# Patient Record
Sex: Female | Born: 1985 | Race: Black or African American | Hispanic: No | Marital: Married | Smoking: Current every day smoker
Health system: Southern US, Community
[De-identification: ages and names within clinical notes are randomized; demographics above are authoritative.]

## PROBLEM LIST (undated history)

## (undated) DIAGNOSIS — F99 Mental disorder, not otherwise specified: Secondary | ICD-10-CM

## (undated) DIAGNOSIS — IMO0002 Reserved for concepts with insufficient information to code with codable children: Secondary | ICD-10-CM

## (undated) DIAGNOSIS — F419 Anxiety disorder, unspecified: Secondary | ICD-10-CM

## (undated) DIAGNOSIS — T7840XA Allergy, unspecified, initial encounter: Secondary | ICD-10-CM

## (undated) DIAGNOSIS — F32A Depression, unspecified: Secondary | ICD-10-CM

## (undated) DIAGNOSIS — I10 Essential (primary) hypertension: Secondary | ICD-10-CM

## (undated) HISTORY — PX: FRACTURE SURGERY: SHX138

## (undated) HISTORY — DX: Allergy, unspecified, initial encounter: T78.40XA

## (undated) HISTORY — DX: Mental disorder, not otherwise specified: F99

## (undated) HISTORY — DX: Depression, unspecified: F32.A

## (undated) HISTORY — DX: Essential (primary) hypertension: I10

## (undated) HISTORY — DX: Reserved for concepts with insufficient information to code with codable children: IMO0002

## (undated) HISTORY — DX: Anxiety disorder, unspecified: F41.9

---
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
## 2005-11-21 ENCOUNTER — Emergency Department (HOSPITAL_COMMUNITY): Admission: EM | Admit: 2005-11-21 | Discharge: 2005-11-21 | Payer: Self-pay | Admitting: Family Medicine

## 2006-01-16 ENCOUNTER — Emergency Department (HOSPITAL_COMMUNITY): Admission: EM | Admit: 2006-01-16 | Discharge: 2006-01-16 | Payer: Self-pay | Admitting: Family Medicine

## 2006-05-15 ENCOUNTER — Emergency Department (HOSPITAL_COMMUNITY): Admission: EM | Admit: 2006-05-15 | Discharge: 2006-05-15 | Payer: Self-pay | Admitting: Family Medicine

## 2007-01-16 ENCOUNTER — Emergency Department (HOSPITAL_COMMUNITY): Admission: EM | Admit: 2007-01-16 | Discharge: 2007-01-16 | Payer: Self-pay | Admitting: Emergency Medicine

## 2007-03-18 ENCOUNTER — Emergency Department (HOSPITAL_COMMUNITY): Admission: EM | Admit: 2007-03-18 | Discharge: 2007-03-18 | Payer: Self-pay | Admitting: Emergency Medicine

## 2008-07-27 ENCOUNTER — Emergency Department (HOSPITAL_COMMUNITY): Admission: EM | Admit: 2008-07-27 | Discharge: 2008-07-27 | Payer: Self-pay | Admitting: Family Medicine

## 2010-07-22 ENCOUNTER — Other Ambulatory Visit: Payer: Self-pay | Admitting: Family Medicine

## 2010-07-22 DIAGNOSIS — IMO0002 Reserved for concepts with insufficient information to code with codable children: Secondary | ICD-10-CM

## 2010-07-22 DIAGNOSIS — Z3689 Encounter for other specified antenatal screening: Secondary | ICD-10-CM

## 2010-07-22 DIAGNOSIS — R87619 Unspecified abnormal cytological findings in specimens from cervix uteri: Secondary | ICD-10-CM

## 2010-07-22 HISTORY — DX: Reserved for concepts with insufficient information to code with codable children: IMO0002

## 2010-07-22 HISTORY — DX: Unspecified abnormal cytological findings in specimens from cervix uteri: R87.619

## 2010-07-23 ENCOUNTER — Ambulatory Visit (HOSPITAL_COMMUNITY)
Admission: RE | Admit: 2010-07-23 | Discharge: 2010-07-23 | Disposition: A | Discharge status: Home / Self Care | Payer: Medicaid Other | Source: Ambulatory Visit | Attending: Family Medicine | Admitting: Family Medicine

## 2010-07-23 ENCOUNTER — Other Ambulatory Visit: Payer: Self-pay | Admitting: Family Medicine

## 2010-07-23 DIAGNOSIS — O139 Gestational [pregnancy-induced] hypertension without significant proteinuria, unspecified trimester: Secondary | ICD-10-CM | POA: Insufficient documentation

## 2010-07-23 DIAGNOSIS — Z3689 Encounter for other specified antenatal screening: Secondary | ICD-10-CM

## 2010-07-25 ENCOUNTER — Inpatient Hospital Stay (HOSPITAL_COMMUNITY): Payer: Medicaid Other

## 2010-07-25 ENCOUNTER — Other Ambulatory Visit: Payer: Self-pay | Admitting: Family Medicine

## 2010-07-25 ENCOUNTER — Inpatient Hospital Stay (HOSPITAL_COMMUNITY)
Admission: AD | Admit: 2010-07-25 | Discharge: 2010-07-25 | Disposition: A | Discharge status: Home / Self Care | Payer: Medicaid Other | Source: Ambulatory Visit | Attending: Obstetrics & Gynecology | Admitting: Obstetrics & Gynecology

## 2010-07-25 DIAGNOSIS — O288 Other abnormal findings on antenatal screening of mother: Secondary | ICD-10-CM

## 2010-07-25 DIAGNOSIS — O4100X0 Oligohydramnios, unspecified trimester, not applicable or unspecified: Secondary | ICD-10-CM | POA: Insufficient documentation

## 2010-07-25 DIAGNOSIS — O368190 Decreased fetal movements, unspecified trimester, not applicable or unspecified: Secondary | ICD-10-CM | POA: Insufficient documentation

## 2010-07-28 ENCOUNTER — Ambulatory Visit (HOSPITAL_COMMUNITY)
Admission: RE | Admit: 2010-07-28 | Discharge: 2010-07-28 | Disposition: A | Discharge status: Home / Self Care | Payer: Medicaid Other | Source: Ambulatory Visit | Attending: Family Medicine | Admitting: Family Medicine

## 2010-07-28 DIAGNOSIS — O139 Gestational [pregnancy-induced] hypertension without significant proteinuria, unspecified trimester: Secondary | ICD-10-CM | POA: Insufficient documentation

## 2010-07-28 DIAGNOSIS — O288 Other abnormal findings on antenatal screening of mother: Secondary | ICD-10-CM

## 2010-07-28 DIAGNOSIS — O4100X0 Oligohydramnios, unspecified trimester, not applicable or unspecified: Secondary | ICD-10-CM | POA: Insufficient documentation

## 2010-07-31 ENCOUNTER — Other Ambulatory Visit: Payer: Self-pay | Admitting: Obstetrics & Gynecology

## 2010-07-31 DIAGNOSIS — O139 Gestational [pregnancy-induced] hypertension without significant proteinuria, unspecified trimester: Secondary | ICD-10-CM

## 2010-07-31 LAB — POCT URINALYSIS DIP (DEVICE)
Bilirubin Urine: NEGATIVE
Nitrite: NEGATIVE
pH: 6 (ref 5.0–8.0)

## 2010-08-01 NOTE — Op Note (Signed)
Jamie Blackwell, Jamie Blackwell             ACCOUNT NO.:  [id number]   MEDICAL RECORD NO.:  [id number]          PATIENT TYPE:  EMS   LOCATION:  ED                           FACILITY:  [facility]   PHYSICIAN:  Johnette Abraham, MD    DATE OF BIRTH:  10-May-1985   DATE OF PROCEDURE:  03/19/2007  DATE OF DISCHARGE:  03/18/2007                               OPERATIVE REPORT   PREOPERATIVE DIAGNOSIS:  Deep abscess of the posterior forearm.   POSTOPERATIVE DIAGNOSIS:  Deep abscess of the posterior forearm.   PROCEDURE:  Incision and drainage of deep abscess of forearm, packing  with Iodoform gauze, and application of a dressing.   SURGEON:  Johnette Abraham, M.D.   INDICATIONS FOR PROCEDURE:  The patient is a pleasant 25 year old black  female who has complained of about a week of swelling of her posterior  forearm and elbow area.  It subsequently has become increasingly warm,  tender to touch, and has now started draining purulence.  She presents  to the emergency room and hand surgery is consulted.  On evaluation, she  had an area of cellulitis extending approximately 10 cm distal to the  olecranon and a central portion of fluctuance and an open draining  wound.  Risks, benefits, and alternatives of drainage were discussed  with the patient.  Consent was obtained.   DESCRIPTION OF PROCEDURE:  The patient was given IV antibiotics by the  emergency department.  The posterior forearm and elbow were prepped and  draped with Betadine solution.  Following the area surrounding the  abscess was infiltrated with 2% lidocaine totaling approximately 10 mL.  A cruciate type incision was made over the point of fluctuance and the  actual open sore.  Purulence was expressed.  Blunt dissection around the  wound was performed unroofing any deep loculated abscesses.  Afterward  the wound was irrigated with irrigation solution and the wound was  thoroughly packed with Iodoform gauze.  Hemostasis was obtained with  direct pressure.  Following a sterile dressing was applied.  The patient  tolerated the procedure well and was sent home with instructions on  wound care as well as antibiotics and pain medicine.  She will follow up  with me on Monday or p.r.n. signs and symptoms worsen.   ANESTHESIA:  Local anesthetic.   COMPLICATIONS:  None.      Johnette Abraham, MD  Electronically Signed     HCC/MEDQ  D:  03/20/2007  T:  03/20/2007  Job:  [id number]

## 2010-08-04 ENCOUNTER — Other Ambulatory Visit: Payer: Medicaid Other

## 2010-08-04 DIAGNOSIS — O139 Gestational [pregnancy-induced] hypertension without significant proteinuria, unspecified trimester: Secondary | ICD-10-CM

## 2010-08-07 ENCOUNTER — Other Ambulatory Visit: Payer: Self-pay | Admitting: Obstetrics and Gynecology

## 2010-08-07 ENCOUNTER — Other Ambulatory Visit: Payer: Medicaid Other

## 2010-08-07 DIAGNOSIS — Z331 Pregnant state, incidental: Secondary | ICD-10-CM

## 2010-08-07 DIAGNOSIS — O139 Gestational [pregnancy-induced] hypertension without significant proteinuria, unspecified trimester: Secondary | ICD-10-CM

## 2010-08-07 DIAGNOSIS — IMO0002 Reserved for concepts with insufficient information to code with codable children: Secondary | ICD-10-CM

## 2010-08-07 LAB — POCT URINALYSIS DIP (DEVICE)
Protein, ur: NEGATIVE mg/dL
Specific Gravity, Urine: 1.025 (ref 1.005–1.030)
pH: 5.5 (ref 5.0–8.0)

## 2010-08-08 ENCOUNTER — Other Ambulatory Visit: Payer: Medicaid Other

## 2010-08-12 ENCOUNTER — Other Ambulatory Visit: Payer: Medicaid Other

## 2010-08-12 DIAGNOSIS — O139 Gestational [pregnancy-induced] hypertension without significant proteinuria, unspecified trimester: Secondary | ICD-10-CM

## 2010-08-14 ENCOUNTER — Other Ambulatory Visit: Payer: Self-pay | Admitting: Obstetrics and Gynecology

## 2010-08-14 DIAGNOSIS — Z331 Pregnant state, incidental: Secondary | ICD-10-CM

## 2010-08-14 DIAGNOSIS — O139 Gestational [pregnancy-induced] hypertension without significant proteinuria, unspecified trimester: Secondary | ICD-10-CM

## 2010-08-14 DIAGNOSIS — O4100X0 Oligohydramnios, unspecified trimester, not applicable or unspecified: Secondary | ICD-10-CM

## 2010-08-14 LAB — POCT URINALYSIS DIP (DEVICE)
Protein, ur: NEGATIVE mg/dL
Specific Gravity, Urine: 1.02 (ref 1.005–1.030)
Urobilinogen, UA: 0.2 mg/dL (ref 0.0–1.0)
pH: 6 (ref 5.0–8.0)

## 2010-08-18 ENCOUNTER — Other Ambulatory Visit: Payer: Medicaid Other

## 2010-08-18 DIAGNOSIS — Z331 Pregnant state, incidental: Secondary | ICD-10-CM

## 2010-08-18 DIAGNOSIS — O139 Gestational [pregnancy-induced] hypertension without significant proteinuria, unspecified trimester: Secondary | ICD-10-CM

## 2010-08-21 ENCOUNTER — Other Ambulatory Visit: Payer: Self-pay | Admitting: Obstetrics & Gynecology

## 2010-08-21 ENCOUNTER — Other Ambulatory Visit: Payer: Self-pay | Admitting: Family Medicine

## 2010-08-21 DIAGNOSIS — O139 Gestational [pregnancy-induced] hypertension without significant proteinuria, unspecified trimester: Secondary | ICD-10-CM

## 2010-08-21 DIAGNOSIS — I10 Essential (primary) hypertension: Secondary | ICD-10-CM

## 2010-08-21 DIAGNOSIS — O4100X0 Oligohydramnios, unspecified trimester, not applicable or unspecified: Secondary | ICD-10-CM

## 2010-08-21 LAB — POCT URINALYSIS DIP (DEVICE)
Nitrite: NEGATIVE
Protein, ur: NEGATIVE mg/dL
Urobilinogen, UA: 0.2 mg/dL (ref 0.0–1.0)
pH: 6 (ref 5.0–8.0)

## 2010-08-25 ENCOUNTER — Ambulatory Visit (HOSPITAL_COMMUNITY)
Admission: RE | Admit: 2010-08-25 | Discharge: 2010-08-25 | Disposition: A | Discharge status: Home / Self Care | Payer: Medicaid Other | Source: Ambulatory Visit | Attending: Obstetrics & Gynecology | Admitting: Obstetrics & Gynecology

## 2010-08-25 ENCOUNTER — Other Ambulatory Visit: Payer: Self-pay | Admitting: Obstetrics and Gynecology

## 2010-08-25 ENCOUNTER — Other Ambulatory Visit: Payer: Medicaid Other

## 2010-08-25 DIAGNOSIS — O139 Gestational [pregnancy-induced] hypertension without significant proteinuria, unspecified trimester: Secondary | ICD-10-CM | POA: Insufficient documentation

## 2010-08-25 DIAGNOSIS — I10 Essential (primary) hypertension: Secondary | ICD-10-CM

## 2010-08-25 DIAGNOSIS — Z3689 Encounter for other specified antenatal screening: Secondary | ICD-10-CM | POA: Insufficient documentation

## 2010-08-28 ENCOUNTER — Other Ambulatory Visit: Payer: Self-pay | Admitting: Obstetrics & Gynecology

## 2010-08-28 ENCOUNTER — Other Ambulatory Visit: Payer: Medicaid Other

## 2010-08-28 DIAGNOSIS — O139 Gestational [pregnancy-induced] hypertension without significant proteinuria, unspecified trimester: Secondary | ICD-10-CM

## 2010-08-28 LAB — POCT URINALYSIS DIP (DEVICE)
Nitrite: NEGATIVE
Protein, ur: NEGATIVE mg/dL
pH: 5.5 (ref 5.0–8.0)

## 2010-09-01 ENCOUNTER — Ambulatory Visit (HOSPITAL_COMMUNITY)
Admission: RE | Admit: 2010-09-01 | Discharge: 2010-09-01 | Disposition: A | Discharge status: Home / Self Care | Payer: Medicaid Other | Source: Ambulatory Visit | Attending: Obstetrics and Gynecology | Admitting: Obstetrics and Gynecology

## 2010-09-01 ENCOUNTER — Other Ambulatory Visit: Payer: Medicaid Other

## 2010-09-01 ENCOUNTER — Inpatient Hospital Stay (HOSPITAL_COMMUNITY)
Admission: AD | Admit: 2010-09-01 | Discharge: 2010-09-04 | DRG: 775 | Disposition: A | Discharge status: Home / Self Care | Payer: Medicaid Other | Source: Ambulatory Visit | Attending: Family Medicine | Admitting: Family Medicine

## 2010-09-01 ENCOUNTER — Other Ambulatory Visit: Payer: Self-pay | Admitting: Obstetrics and Gynecology

## 2010-09-01 DIAGNOSIS — O139 Gestational [pregnancy-induced] hypertension without significant proteinuria, unspecified trimester: Principal | ICD-10-CM | POA: Diagnosis present

## 2010-09-01 DIAGNOSIS — Z22330 Carrier of Group B streptococcus: Secondary | ICD-10-CM

## 2010-09-01 DIAGNOSIS — O4100X0 Oligohydramnios, unspecified trimester, not applicable or unspecified: Secondary | ICD-10-CM | POA: Diagnosis present

## 2010-09-01 DIAGNOSIS — O99892 Other specified diseases and conditions complicating childbirth: Secondary | ICD-10-CM | POA: Diagnosis present

## 2010-09-01 DIAGNOSIS — Z01812 Encounter for preprocedural laboratory examination: Secondary | ICD-10-CM

## 2010-09-01 DIAGNOSIS — Z3689 Encounter for other specified antenatal screening: Secondary | ICD-10-CM | POA: Insufficient documentation

## 2010-09-01 DIAGNOSIS — Z01818 Encounter for other preprocedural examination: Secondary | ICD-10-CM

## 2010-09-01 LAB — CBC
MCH: 26.6 pg (ref 26.0–34.0)
MCHC: 32.8 g/dL (ref 30.0–36.0)
RDW: 14.4 % (ref 11.5–15.5)

## 2010-09-01 LAB — RPR: RPR Ser Ql: NONREACTIVE

## 2010-09-02 DIAGNOSIS — O4100X0 Oligohydramnios, unspecified trimester, not applicable or unspecified: Secondary | ICD-10-CM

## 2010-09-02 DIAGNOSIS — O139 Gestational [pregnancy-induced] hypertension without significant proteinuria, unspecified trimester: Secondary | ICD-10-CM

## 2010-09-02 LAB — CBC
Hemoglobin: 10.8 g/dL — ABNORMAL LOW (ref 12.0–15.0)
MCHC: 33 g/dL (ref 30.0–36.0)
MCHC: 32.7 g/dL (ref 30.0–36.0)
MCV: 81.1 fL (ref 78.0–100.0)
Platelets: 190 10*3/uL (ref 150–400)
Platelets: 180 10*3/uL (ref 150–400)
RBC: 4.05 MIL/uL (ref 3.87–5.11)
RDW: 14.4 % (ref 11.5–15.5)
WBC: 13.2 10*3/uL — ABNORMAL HIGH (ref 4.0–10.5)

## 2010-09-02 LAB — ABO/RH: ABO/RH(D): O POS

## 2010-10-09 ENCOUNTER — Ambulatory Visit (INDEPENDENT_AMBULATORY_CARE_PROVIDER_SITE_OTHER): Payer: Medicaid Other | Admitting: Obstetrics and Gynecology

## 2010-10-09 ENCOUNTER — Encounter: Payer: Self-pay | Admitting: *Deleted

## 2010-10-09 NOTE — Progress Notes (Signed)
Subjective:     Patient ID: Jamie Blackwell, female   DOB: 05-10-1985, 25 y.o.   MRN: [id number]  HPI patient is a 25 year old African American female gravida 1 para 1001 who delivered on June 19 a normal spontaneous controlled delivery she first degree lacerations which required 2 stitches she delivered a. She delivered of a female infant weighing 7 lbs. 12 oz. She's breast-feeding and will continue for about one year. She has no complaints all is going well. The patient wants no contraception she states that she is a lesbian and just wanted to have the baby. She lives with her partner, who is 9 years older than she. She was induced the day before her due date because of hypertension since her delivery her blood pressure has been normal. She had no other complications and has had no headache and has had a normal postpartum recovery.   Review of Systems Review of systems is negative    Objective:   Physical Exam patient's current blood pressure is 109/76. Her pulse is 84 per minute. Patient present weight is 234 pounds down from 257 at the end of her pregnancy. Breasts: No dominant masses no nipple discharge, large and pendulous no cervical supraclavicular 8 or axillary nodes. Abdomen: Flat soft nontender no masses no organomegaly. Pelvic external genitalia normal the lungs within normal limits vagina clean and well rugated. Uterus well involuted cervix clean. Adnexa could not be palpated because of habits the patient.    Assessment:    patient had a Pap smear in May of this year which showed ascus without high-risk HPV therefore Pap smear will not be repeated until next May.    Plan:     Patient to continue prenatal vitamins as long as she is nursing. And is to return in May 2013 for Pap smear.

## 2011-02-07 ENCOUNTER — Emergency Department (HOSPITAL_COMMUNITY)
Admission: EM | Admit: 2011-02-07 | Discharge: 2011-02-07 | Disposition: A | Discharge status: Home / Self Care | Payer: Medicaid Other | Attending: Emergency Medicine | Admitting: Emergency Medicine

## 2011-02-07 ENCOUNTER — Encounter (HOSPITAL_COMMUNITY): Payer: Self-pay | Admitting: Adult Health

## 2011-02-07 ENCOUNTER — Emergency Department (HOSPITAL_COMMUNITY): Payer: Medicaid Other

## 2011-02-07 DIAGNOSIS — S139XXA Sprain of joints and ligaments of unspecified parts of neck, initial encounter: Secondary | ICD-10-CM | POA: Insufficient documentation

## 2011-02-07 DIAGNOSIS — S161XXA Strain of muscle, fascia and tendon at neck level, initial encounter: Secondary | ICD-10-CM

## 2011-02-07 DIAGNOSIS — W1809XA Striking against other object with subsequent fall, initial encounter: Secondary | ICD-10-CM | POA: Insufficient documentation

## 2011-02-07 DIAGNOSIS — I10 Essential (primary) hypertension: Secondary | ICD-10-CM | POA: Insufficient documentation

## 2011-02-07 DIAGNOSIS — Y92009 Unspecified place in unspecified non-institutional (private) residence as the place of occurrence of the external cause: Secondary | ICD-10-CM | POA: Insufficient documentation

## 2011-02-07 MED ORDER — ACETAMINOPHEN-CODEINE #3 300-30 MG PO TABS: 1.0000 | ORAL_TABLET | Freq: Four times a day (QID) | ORAL | Status: AC | PRN

## 2011-02-07 MED ORDER — IBUPROFEN 800 MG PO TABS: 800.0000 mg | ORAL_TABLET | Freq: Three times a day (TID) | ORAL | Status: AC

## 2011-02-07 MED ORDER — DIAZEPAM 5 MG PO TABS: 5.0000 mg | ORAL_TABLET | Freq: Two times a day (BID) | ORAL | Status: AC

## 2011-02-07 MED ORDER — DIAZEPAM 5 MG PO TABS
5.0000 mg | ORAL_TABLET | Freq: Once | ORAL | Status: AC
  Administered 2011-02-07: 5 mg via ORAL
  Filled 2011-02-07: qty 1

## 2011-02-07 MED ORDER — IBUPROFEN 800 MG PO TABS
800.0000 mg | ORAL_TABLET | Freq: Once | ORAL | Status: AC
  Administered 2011-02-07: 800 mg via ORAL
  Filled 2011-02-07: qty 1

## 2011-02-07 MED ORDER — ACETAMINOPHEN-CODEINE #3 300-30 MG PO TABS
1.0000 | ORAL_TABLET | Freq: Once | ORAL | Status: AC
  Administered 2011-02-07: 1 via ORAL
  Filled 2011-02-07: qty 1

## 2011-02-07 NOTE — ED Provider Notes (Signed)
History     CSN: [id number] Arrival date & time: 02/07/2011 12:41 PM   None     Chief Complaint  Patient presents with  . Neck Injury    (Consider location/radiation/quality/duration/timing/severity/associated sxs/prior treatment) HPI  Patient presents to emergency department complaining of gradual onset left-sided neck pain after she states she slipped in the bathroom this morning falling into the counter hitting the left side of her neck into the countertop. Patient states she did not fall to the ground but caught herself with her arms and hitting her neck. Denies loss of consciousness. Patient states that the day progressed increasing pain in the left side of her neck with increasing stiffness. Patient has not taken anything for pain prior to arrival. Patient states pain is aggravated by movement and therefore has to keep her head "completely still." Denies numbness or tingling into her upper extremities. Denies weakness of upper extremities. Denies any other injury or pain. Patient takes no medicine other than prenatal vitamin on a regular basis. Patient is 5 months postpartum and no longer nursing.  Past Medical History  Diagnosis Date  . Hypertension   . Abnormal Pap smear 07/22/10    ascus  . Mental disorder     depression    History reviewed. No pertinent past surgical history.  Family History  Problem Relation Age of Onset  . Cancer Paternal Grandmother   . Hypertension Mother     History  Substance Use Topics  . Smoking status: Former Smoker    Types: Cigarettes  . Smokeless tobacco: Never Used  . Alcohol Use: No    OB History    Grav Para Term Preterm Abortions TAB SAB Ect Mult Living   1 1 1  0 0 0 0 0 0 1      Review of Systems  All other systems reviewed and are negative.    Allergies  Review of patient's allergies indicates no known allergies.  Home Medications   Current Outpatient Rx  Name Route Sig Dispense Refill  . NATALCARE PIC 60-1 MG  PO TABS Oral Take 1 tablet by mouth daily with breakfast.        BP 116/75  Pulse 75  Temp(Src) 98.2 F (36.8 C) (Oral)  Resp 21  SpO2 100%  Physical Exam  Nursing note and vitals reviewed. Constitutional: She is oriented to person, place, and time. She appears well-developed and well-nourished. No distress.  HENT:  Head: Normocephalic and atraumatic.  Eyes: Conjunctivae and EOM are normal. Pupils are equal, round, and reactive to light.  Neck: Neck supple. No tracheal deviation present.       Soft tissue tenderness to palpation of left lateral neck with muscle spasticity but no bruising, crepitus, or break in skin. No bruit. Mild midline tenderness to palpation. Decreased range of motion due to pain lateral neck.  Cardiovascular: Normal rate, regular rhythm, normal heart sounds and intact distal pulses.  Exam reveals no gallop and no friction rub.   No murmur heard. Pulmonary/Chest: Effort normal and breath sounds normal. No respiratory distress. She has no wheezes. She has no rales. She exhibits no tenderness.  Abdominal: Bowel sounds are normal. She exhibits no distension and no mass. There is no tenderness. There is no rebound and no guarding.  Musculoskeletal: Normal range of motion. She exhibits tenderness. She exhibits no edema.       Mild midline cervical tenderness to palpation but mostly lateral soft tissue tenderness to palpation.  Full range of motion of bilateral upper  extremities with normal reflexes and strength.  Neurological: She is alert and oriented to person, place, and time. She has normal reflexes. No cranial nerve deficit. Coordination normal.  Skin: Skin is warm and dry. No rash noted. She is not diaphoretic. No erythema.  Psychiatric: She has a normal mood and affect.    ED Course  Procedures (including critical care time)  PO valium, ibuprofen, tylenol #3  Labs Reviewed - No data to display Dg Cervical Spine Complete  02/07/2011  *RADIOLOGY REPORT*   Clinical Data: Larey Seat  CERVICAL SPINE - COMPLETE 4+ VIEW  Comparison: None.  Findings: Negative for fracture, dislocation, or other acute bone abnormality.  No prevertebral soft tissue swelling.  No significant degenerative change.  IMPRESSION:  Negative for fracture or other acute abnormality.  Original Report Authenticated By: Thora Lance III, M.D.     1. Cervical strain       MDM  No acute findings on x-ray of cervical spine with left-sided muscle spasticity but is consistent with cervical strain but no signs or symptoms of central cord compression. Patient has bilateral upper extremity strength of 5 out of 5 and normal reflexes.       Jenness Corner, [place] 02/07/11 1436

## 2011-02-07 NOTE — ED Provider Notes (Signed)
Medical screening examination/treatment/procedure(s) were performed by non-physician practitioner and as supervising physician I was immediately available for consultation/collaboration. Devoria Albe, MD, Armando Gang   Ward Givens, MD 02/07/11 [phone]

## 2011-02-07 NOTE — ED Notes (Signed)
Slipped and hit left side of neck on counter. Unable to move head from side to side without pain. Pain describe as shooting.

## 2012-09-28 IMAGING — US US OB COMP +14 WK
1 series · 12 of 28 positions shown · non-contrast
Comparison: none

[Series 1: us ob detail +14 wk · 12 of 61 slices shown]
[im 3/61]
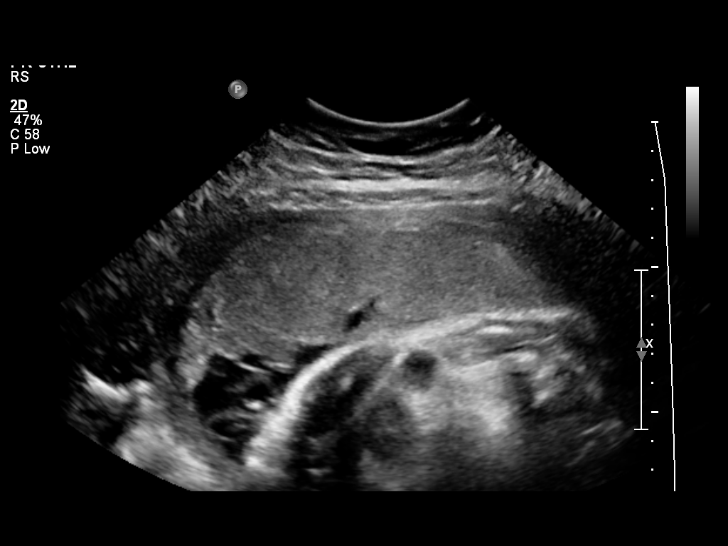
[im 7/61]
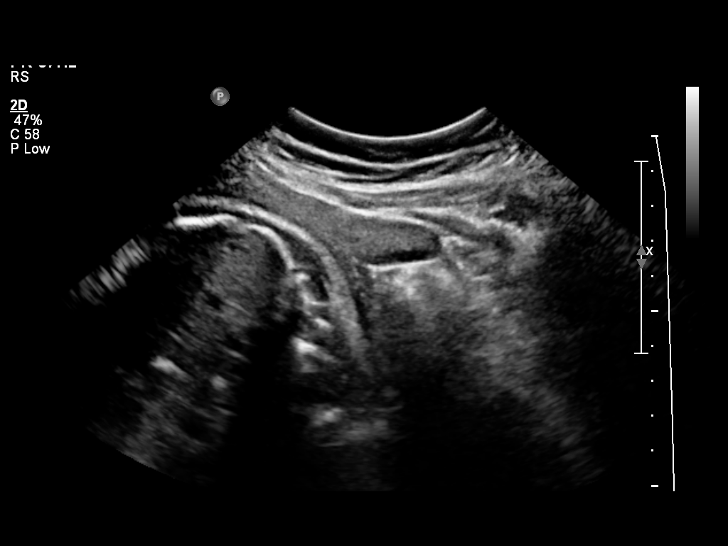
[im 12/61]
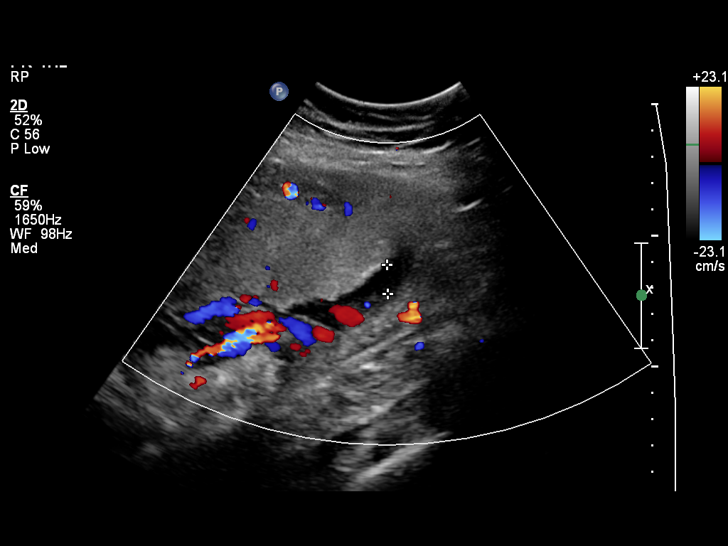
[im 18/61]
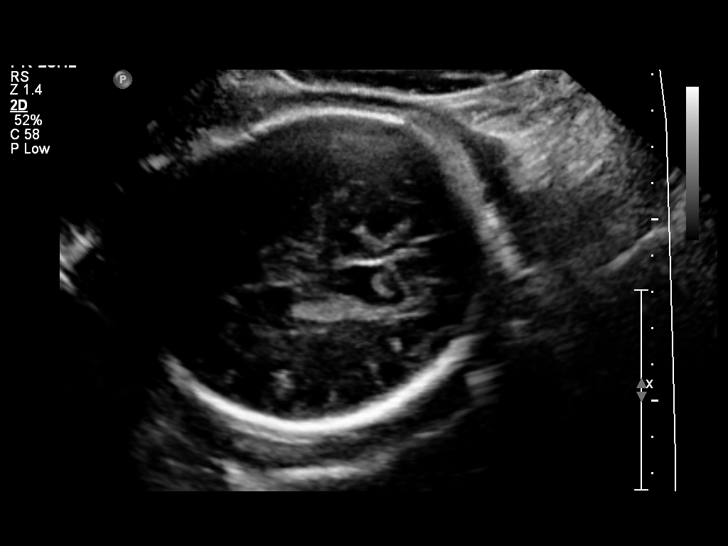
[im 23/61]
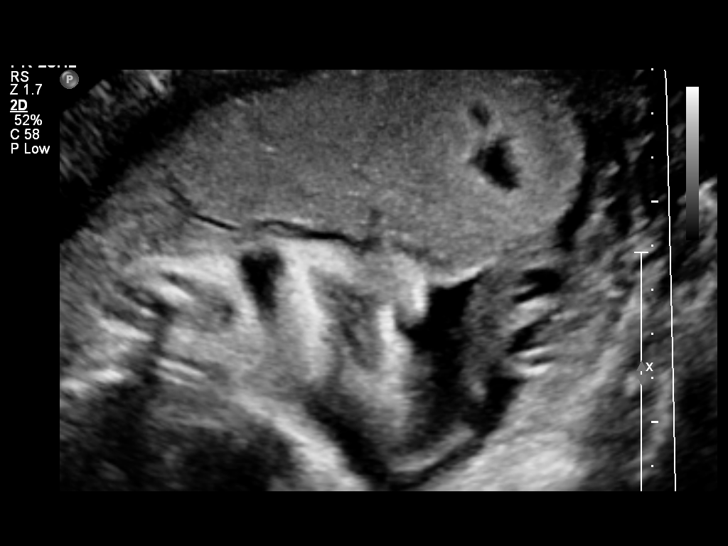
[im 27/61]
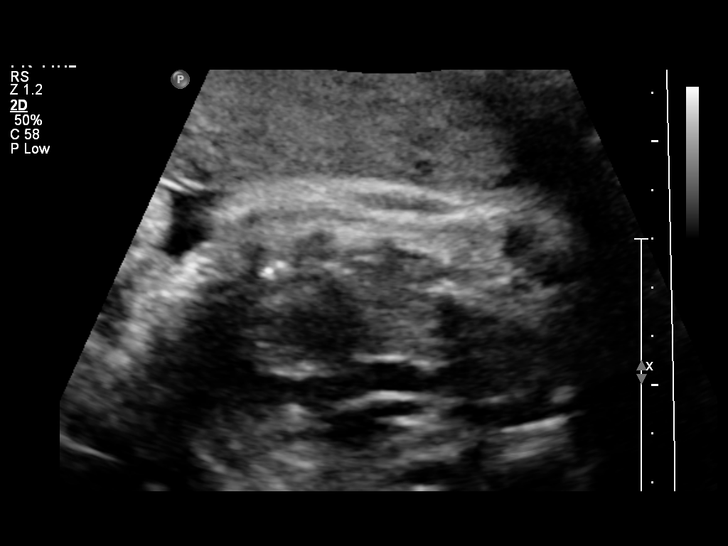
[im 34/61]
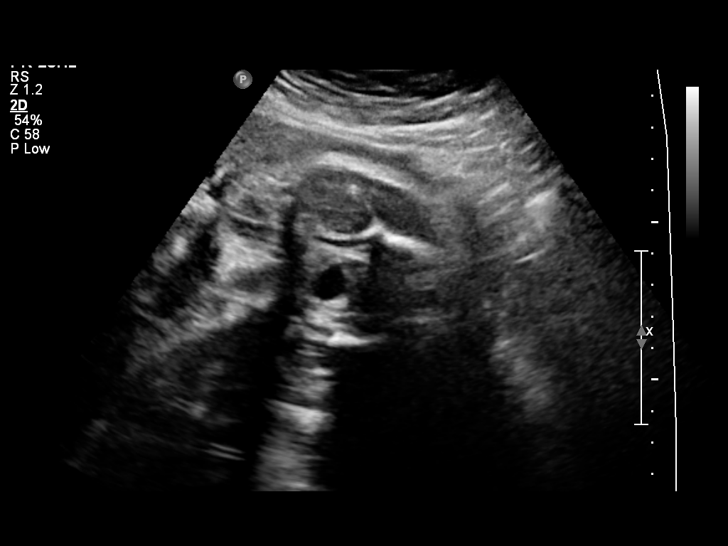
[im 38/61]
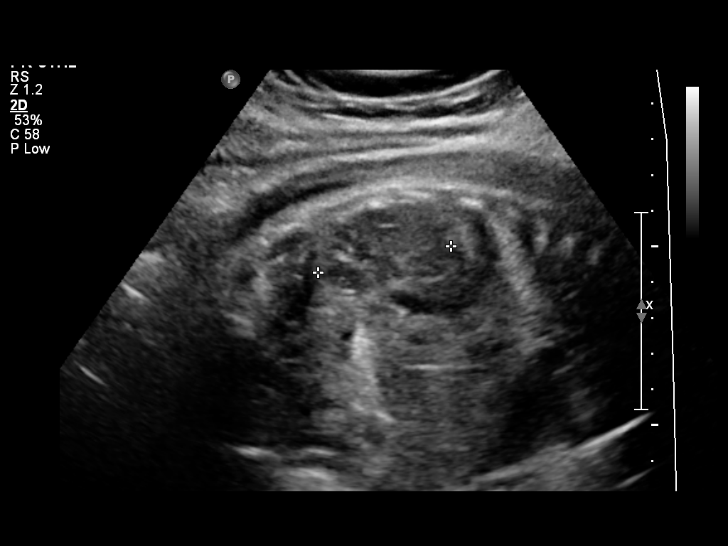
[im 43/61]
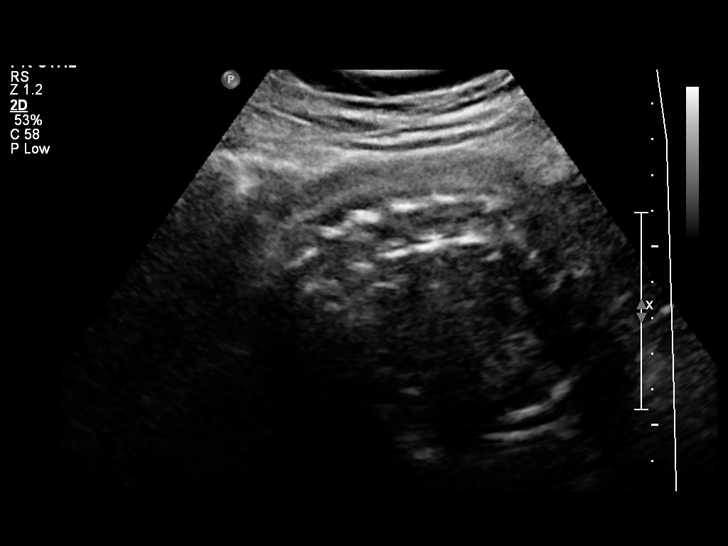
[im 49/61]
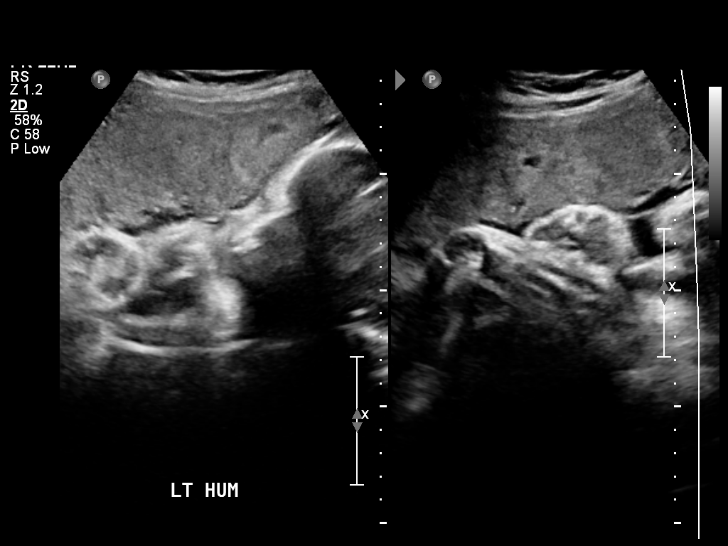
[im 54/61]
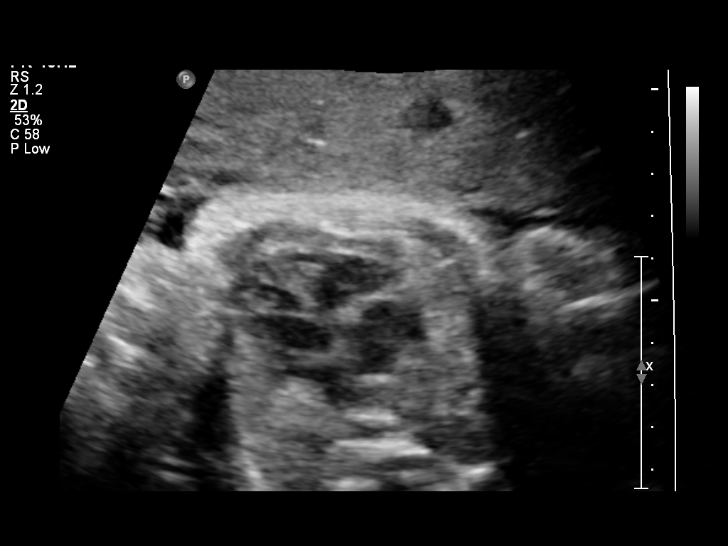
[im 58/61]
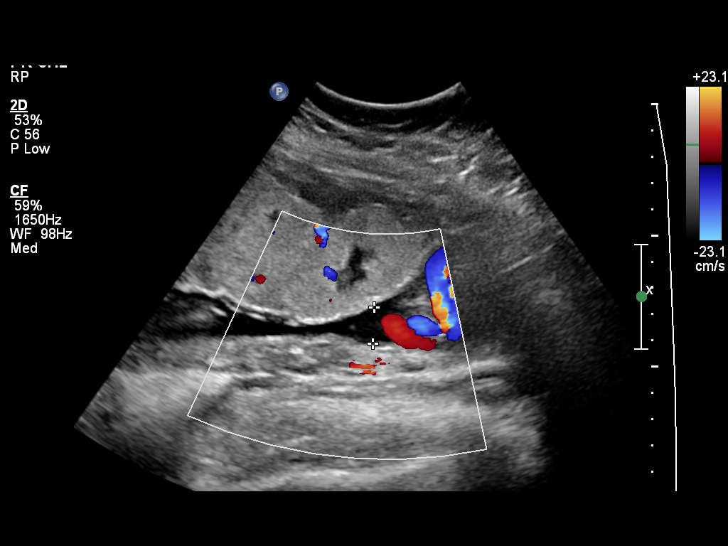

[12 of 28 positions shown; findings below may reference images not displayed]

OBSTETRICS REPORT
                      (Signed Final 07/23/2010 [DATE])

 Order#:         [id number]
Procedures

 US OB COMP + 14 WK                                    76805.1
Indications

 Unsure of LMP;  Establish Gestational [AGE]
 Basic anatomic survey
 Hypertension - Gestational
Fetal Evaluation

 Fetal Heart Rate:  150                          bpm
 Cardiac Activity:  Observed
 Presentation:      Cephalic
 Placenta:          Anterior, above cervical os
 P. Cord            Not well visualized
 Insertion:

 Amniotic Fluid
 AFI FV:      Subjectively low-normal
 AFI Sum:     8.75    cm       10  %Tile     Larg Pckt:    3.68  cm
 RUQ:   2.19    cm   RLQ:    1.38   cm    LUQ:   3.68    cm   LLQ:    1.5    cm
Biometry

 BPD:     82.9  mm     G. Age:  33w 3d                CI:        72.49   70 - 86
                                                      FL/HC:      21.1   19.4 -

 HC:     309.7  mm     G. Age:  34w 4d       29  %    HC/AC:      1.02   0.96 -

 AC:     303.7  mm     G. Age:  34w 3d       63  %    FL/BPD:     79.0   71 - 87
 FL:      65.5  mm     G. Age:  33w 5d       34  %    FL/AC:      21.6   20 - 24

 Est. FW:    7507  gm      5 lb 3 oz     61  %
Gestational Age

 U/S Today:     34w 0d                                        EDD:   09/03/10
 Best:          34w 0d     Det. By:  U/S (07/23/10)           EDD:   09/03/10
Anatomy

 Cranium:           Appears normal      Aortic Arch:       Basic anatomy
                                                           exam per order
 Fetal Cavum:       Appears normal      Ductal Arch:       Basic anatomy
                                                           exam per order
 Ventricles:        Appears normal      Diaphragm:         Basic anatomy
                                                           exam per order
 Choroid Plexus:    Appears normal      Stomach:           Appears normal
 Cerebellum:        Appears normal      Abdomen:           Appears normal
 Posterior Fossa:   Appears normal      Abdominal Wall:    Not well
                                                           visualized
 Nuchal Fold:       Not applicable      Cord Vessels:      Appears normal
                    (>20 wks GA)                           (3 vessel cord)
 Face:              Lips appear         Kidneys:           Appear normal
                    normal
 Heart:             Appears normal      Bladder:           Appears normal
                    (4 chamber &
                    axis)
 RVOT:              Basic anatomy       Spine:             Appears normal
                    exam per order
 LVOT:              Appears normal      Limbs:             Four extremities
                                                           seen

 Other:     Fetus appears to be a male. Technically difficult due to
            advanced GA and fetal position.
Cervix Uterus Adnexa

 Cervix:       Not visualized (advanced GA >34 wks)

 Left Ovary:    Within normal limits.
 Right Ovary:   Within normal limits.
 Adnexa:     No abnormality visualized.
Impression

 Single living IUP with US Gest. Age of 34w 0d, and EDD of
 09/03/2010.
 Suboptimal visualization of fetal anatomy due to advanced
 GA, however no fetal anomalies identified.
 Amniotic fluid in low-normal range, with AFI of 8.75 cm (10
 %ile).

 questions or concerns.

## 2012-09-30 IMAGING — US US FETAL BPP W/O NONSTRESS
1 series · 13 of 27 positions shown · non-contrast
Comparison: none

[Series 1: us fetal bpp w/o nonstress · non-contrast · 27 acquisitions, 13 frames shown]
[im 2/27]
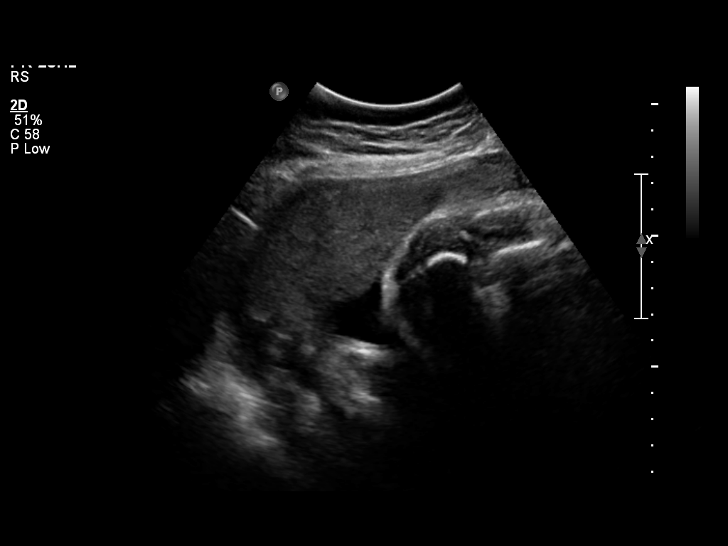
[im 4/27]
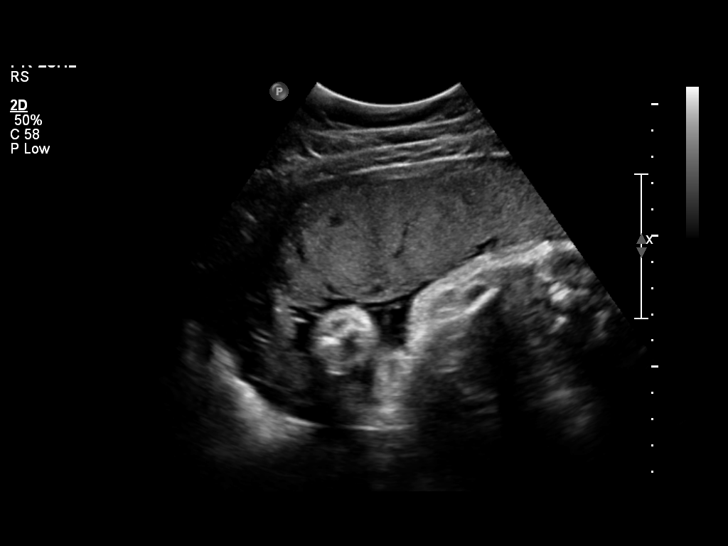
[im 6/27]
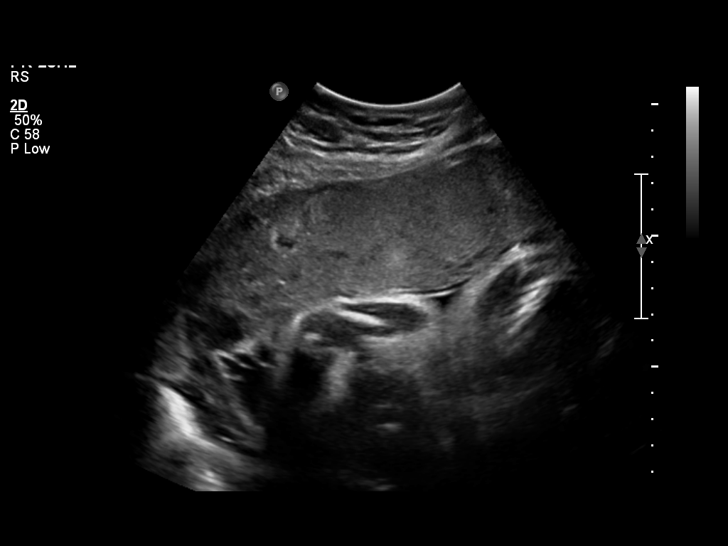
[im 8/27]
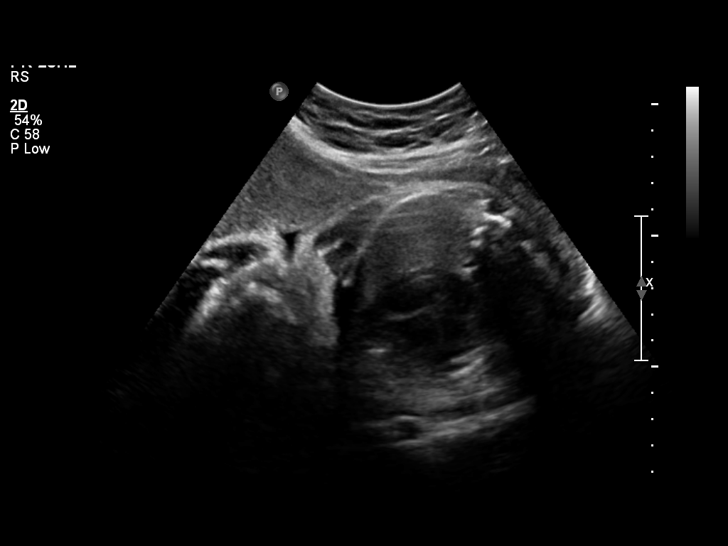
[im 10/27]
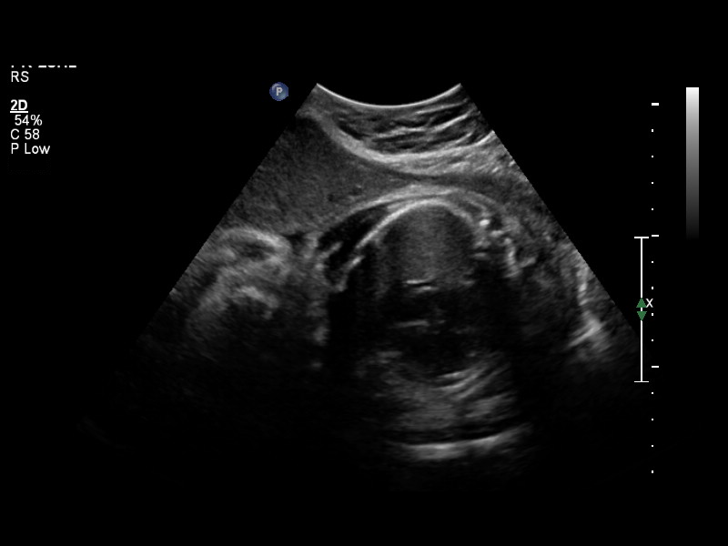
[im 12/27]
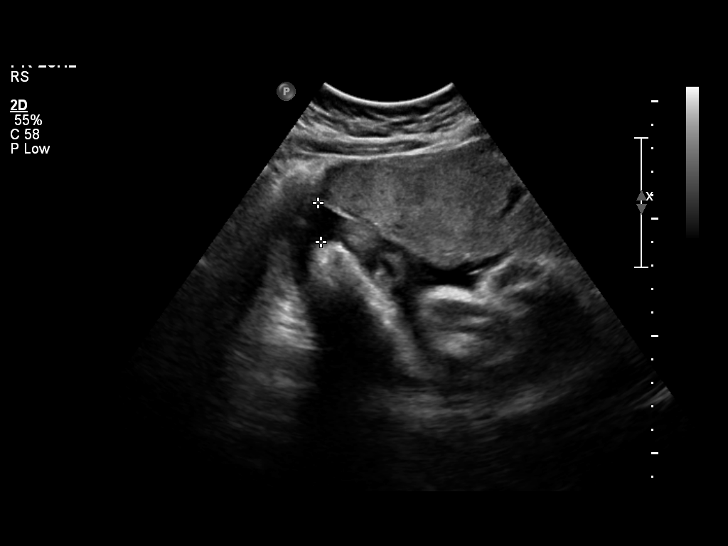
[im 14/27]
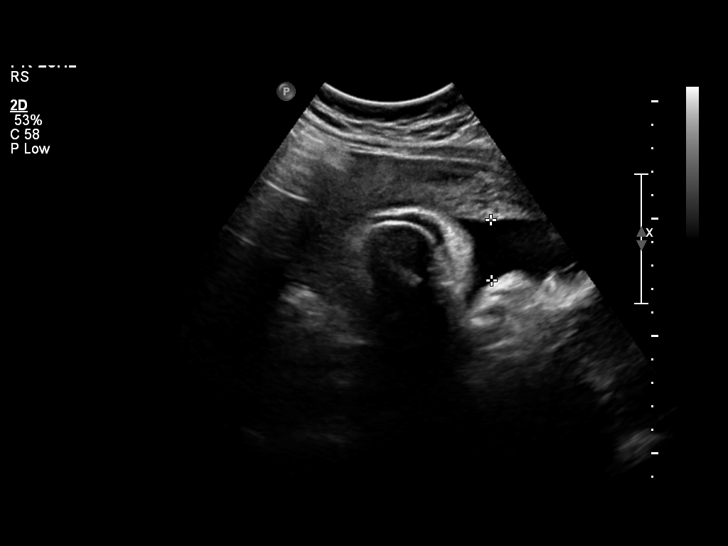
[im 16/27]
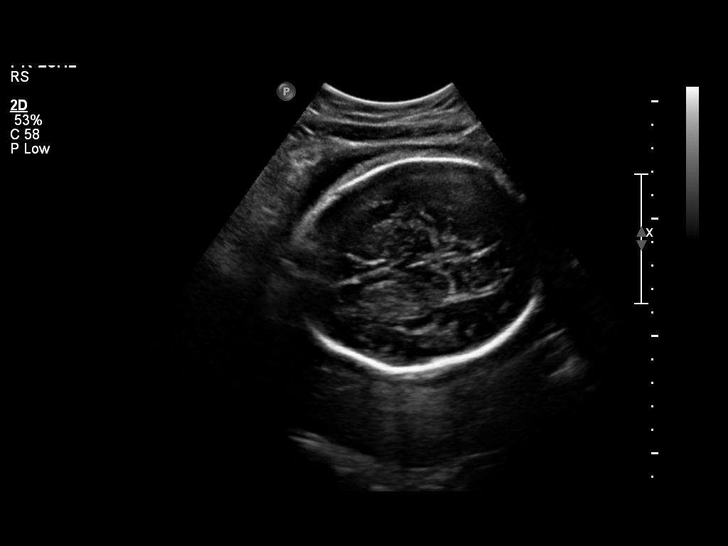
[im 18/27]
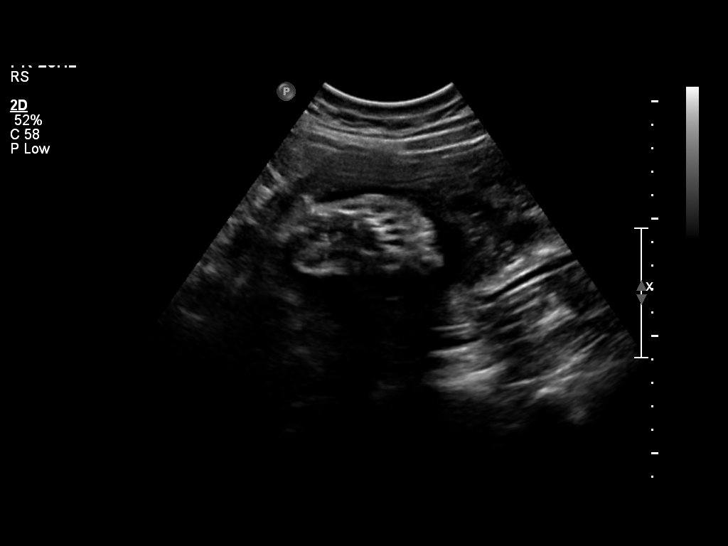
[im 20/27]
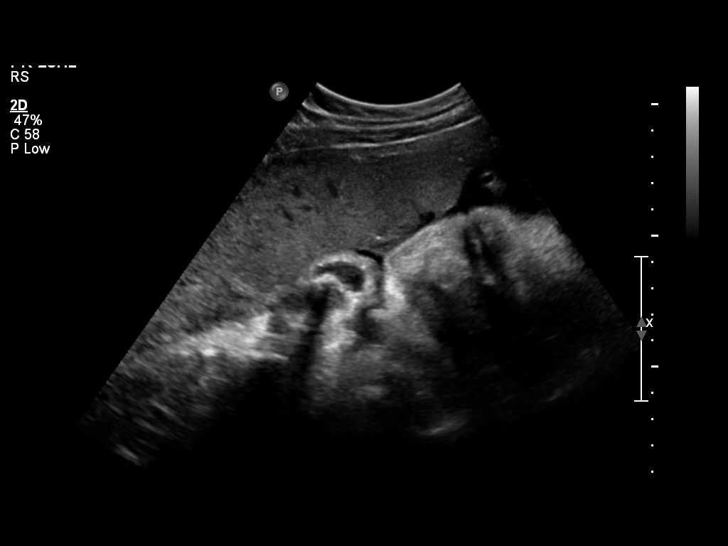
[im 22/27]
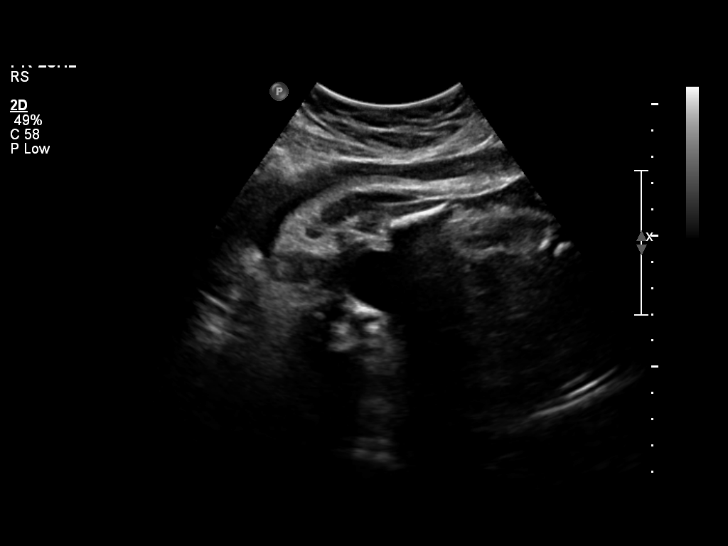
[im 24/27]
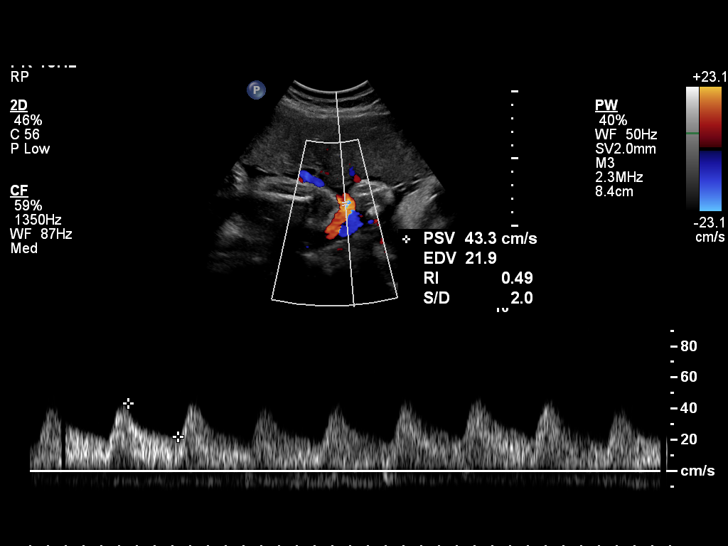
[im 26/27]
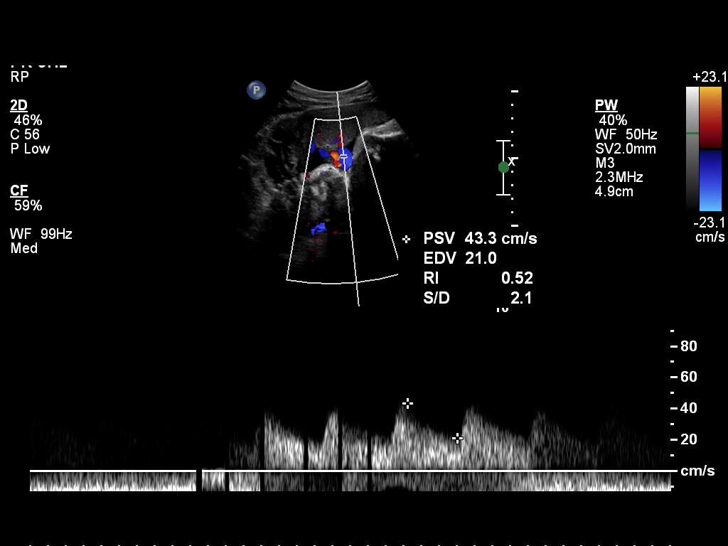

[13 of 27 positions shown; findings below may reference images not displayed]

OBSTETRICS REPORT
                      (Signed Final 07/28/2010 [DATE])

Procedures

 US UA Cord Doppler                                    76827.2
Indications

 Decreased fetal movement
Fetal Evaluation

 Fetal Heart Rate:  150                          bpm
 Cardiac Activity:  Observed
 Presentation:      Cephalic
 Placenta:          Anterior Fundal, above
                    cervical os

 Amniotic Fluid
 AFI FV:      Subjectively low-normal
 AFI Sum:     10.57   cm       24  %Tile     Larg Pckt:    3.94  cm
 RUQ:   1.67    cm   RLQ:    2.57   cm    LUQ:   3.94    cm   LLQ:    2.39   cm
Biophysical Evaluation

 Amniotic F.V:   Pocket => 2 cm two         F. Tone:        Observed
                 planes
 F. Movement:    Observed                   Score:          [DATE]
 F. Breathing:   Observed
Gestational Age

 Best:          34w 2d     Det. By:  U/S (07/23/10)           EDD:   09/03/10
Doppler - Fetal Vessels

 Umbilical Artery
 S/D:   2.2            31  %tile
 Umbilical Artery
 Absent DFV:    No     Reverse DFV:    No

Impression

 Biophysical profile score of [DATE].
 Amniotic fluid in low-normal range, with AFI of 10.57 cm.
 UA Doppler within normal limits.

 questions or concerns.

## 2012-12-18 ENCOUNTER — Encounter (HOSPITAL_COMMUNITY): Payer: Self-pay | Admitting: Cardiology

## 2012-12-18 ENCOUNTER — Emergency Department (HOSPITAL_COMMUNITY)
Admission: EM | Admit: 2012-12-18 | Discharge: 2012-12-18 | Disposition: A | Discharge status: Home / Self Care | Payer: Medicaid Other | Attending: Emergency Medicine | Admitting: Emergency Medicine

## 2012-12-18 DIAGNOSIS — Z8742 Personal history of other diseases of the female genital tract: Secondary | ICD-10-CM | POA: Insufficient documentation

## 2012-12-18 DIAGNOSIS — Y92410 Unspecified street and highway as the place of occurrence of the external cause: Secondary | ICD-10-CM | POA: Insufficient documentation

## 2012-12-18 DIAGNOSIS — I10 Essential (primary) hypertension: Secondary | ICD-10-CM | POA: Insufficient documentation

## 2012-12-18 DIAGNOSIS — IMO0002 Reserved for concepts with insufficient information to code with codable children: Secondary | ICD-10-CM | POA: Insufficient documentation

## 2012-12-18 DIAGNOSIS — T148XXA Other injury of unspecified body region, initial encounter: Secondary | ICD-10-CM

## 2012-12-18 DIAGNOSIS — Z87891 Personal history of nicotine dependence: Secondary | ICD-10-CM | POA: Insufficient documentation

## 2012-12-18 DIAGNOSIS — S0990XA Unspecified injury of head, initial encounter: Secondary | ICD-10-CM | POA: Insufficient documentation

## 2012-12-18 DIAGNOSIS — Y9389 Activity, other specified: Secondary | ICD-10-CM | POA: Insufficient documentation

## 2012-12-18 DIAGNOSIS — Z8659 Personal history of other mental and behavioral disorders: Secondary | ICD-10-CM | POA: Insufficient documentation

## 2012-12-18 MED ORDER — DIAZEPAM 5 MG PO TABS
5.0000 mg | ORAL_TABLET | Freq: Once | ORAL | Status: AC
  Administered 2012-12-18: 5 mg via ORAL
  Filled 2012-12-18: qty 1

## 2012-12-18 MED ORDER — DIAZEPAM 5 MG PO TABS: 5.0000 mg | ORAL_TABLET | Freq: Two times a day (BID) | ORAL | Status: DC

## 2012-12-18 MED ORDER — IBUPROFEN 400 MG PO TABS
800.0000 mg | ORAL_TABLET | Freq: Once | ORAL | Status: AC
  Administered 2012-12-18: 800 mg via ORAL
  Filled 2012-12-18: qty 2
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
## 2012-12-18 NOTE — ED Provider Notes (Signed)
Medical screening examination/treatment/procedure(s) were performed by non-physician practitioner and as supervising physician I was immediately available for consultation/collaboration.   William Deronda Christian, MD 12/18/12 2328 
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
## 2012-12-18 NOTE — ED Notes (Signed)
Pt discharged.Vital signs stable and GCS 15.Pt is painfree but just complain of discomfort.

## 2012-12-19 ENCOUNTER — Emergency Department (HOSPITAL_COMMUNITY)
Admission: EM | Admit: 2012-12-19 | Discharge: 2012-12-19 | Disposition: A | Discharge status: Home / Self Care | Payer: Medicaid Other | Source: Home / Self Care

## 2012-12-19 ENCOUNTER — Emergency Department (HOSPITAL_COMMUNITY)
Admission: EM | Admit: 2012-12-19 | Discharge: 2012-12-19 | Disposition: A | Discharge status: Home / Self Care | Payer: Medicaid Other | Attending: Emergency Medicine | Admitting: Emergency Medicine

## 2012-12-19 ENCOUNTER — Emergency Department (HOSPITAL_COMMUNITY): Payer: Medicaid Other

## 2012-12-19 ENCOUNTER — Encounter (HOSPITAL_COMMUNITY): Payer: Self-pay

## 2012-12-19 DIAGNOSIS — Z79899 Other long term (current) drug therapy: Secondary | ICD-10-CM | POA: Insufficient documentation

## 2012-12-19 DIAGNOSIS — S139XXA Sprain of joints and ligaments of unspecified parts of neck, initial encounter: Secondary | ICD-10-CM | POA: Insufficient documentation

## 2012-12-19 DIAGNOSIS — I10 Essential (primary) hypertension: Secondary | ICD-10-CM | POA: Insufficient documentation

## 2012-12-19 DIAGNOSIS — Y9389 Activity, other specified: Secondary | ICD-10-CM | POA: Insufficient documentation

## 2012-12-19 DIAGNOSIS — M542 Cervicalgia: Secondary | ICD-10-CM

## 2012-12-19 DIAGNOSIS — Y92410 Unspecified street and highway as the place of occurrence of the external cause: Secondary | ICD-10-CM | POA: Insufficient documentation

## 2012-12-19 DIAGNOSIS — M549 Dorsalgia, unspecified: Secondary | ICD-10-CM

## 2012-12-19 DIAGNOSIS — F3289 Other specified depressive episodes: Secondary | ICD-10-CM | POA: Insufficient documentation

## 2012-12-19 DIAGNOSIS — S161XXD Strain of muscle, fascia and tendon at neck level, subsequent encounter: Secondary | ICD-10-CM

## 2012-12-19 DIAGNOSIS — G8911 Acute pain due to trauma: Secondary | ICD-10-CM | POA: Insufficient documentation

## 2012-12-19 DIAGNOSIS — Z87891 Personal history of nicotine dependence: Secondary | ICD-10-CM | POA: Insufficient documentation

## 2012-12-19 DIAGNOSIS — F329 Major depressive disorder, single episode, unspecified: Secondary | ICD-10-CM | POA: Insufficient documentation

## 2012-12-19 DIAGNOSIS — IMO0002 Reserved for concepts with insufficient information to code with codable children: Secondary | ICD-10-CM | POA: Insufficient documentation

## 2012-12-19 MED ORDER — DIAZEPAM 5 MG PO TABS: 10.0000 mg | ORAL_TABLET | Freq: Once | ORAL | Status: DC

## 2012-12-19 MED ORDER — NAPROXEN 500 MG PO TABS
500.0000 mg | ORAL_TABLET | Freq: Once | ORAL | Status: AC
  Administered 2012-12-19: 500 mg via ORAL
  Filled 2012-12-19: qty 1

## 2012-12-19 MED ORDER — HYDROCODONE-ACETAMINOPHEN 5-325 MG PO TABS: 2.0000 | ORAL_TABLET | Freq: Once | ORAL | Status: DC

## 2012-12-19 MED ORDER — DIAZEPAM 5 MG/ML IJ SOLN: 5.0000 mg | Freq: Once | INTRAMUSCULAR | Status: DC

## 2012-12-19 MED ORDER — KETOROLAC TROMETHAMINE 60 MG/2ML IM SOLN: 60.0000 mg | Freq: Once | INTRAMUSCULAR | Status: DC

## 2012-12-19 MED ORDER — CYCLOBENZAPRINE HCL 10 MG PO TABS
10.0000 mg | ORAL_TABLET | Freq: Once | ORAL | Status: AC
  Administered 2012-12-19: 10 mg via ORAL
  Filled 2012-12-19: qty 1

## 2012-12-19 MED ORDER — NAPROXEN 500 MG PO TABS: 500.0000 mg | ORAL_TABLET | Freq: Two times a day (BID) | ORAL | Status: DC

## 2012-12-19 MED ORDER — KETOROLAC TROMETHAMINE 60 MG/2ML IM SOLN
60.0000 mg | Freq: Once | INTRAMUSCULAR | Status: DC
  Filled 2012-12-19: qty 2

## 2012-12-19 MED ORDER — CYCLOBENZAPRINE HCL 10 MG PO TABS: 10.0000 mg | ORAL_TABLET | Freq: Two times a day (BID) | ORAL | Status: DC | PRN

## 2012-12-19 NOTE — ED Notes (Signed)
Pt refused toradol
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
## 2012-12-20 NOTE — ED Provider Notes (Signed)
Medical screening examination/treatment/procedure(s) were performed by non-physician practitioner and as supervising physician I was immediately available for consultation/collaboration.  Kynzie Polgar R. Nolia Tschantz, MD 12/20/12 1432 

## 2012-12-22 NOTE — ED Provider Notes (Signed)
Medical screening examination/treatment/procedure(s) were performed by non-physician practitioner and as supervising physician I was immediately available for consultation/collaboration.   Richardean Canal, MD 12/22/12 1450

## 2014-01-15 ENCOUNTER — Encounter (HOSPITAL_COMMUNITY): Payer: Self-pay

## 2014-11-04 ENCOUNTER — Emergency Department (HOSPITAL_COMMUNITY)
Admission: EM | Admit: 2014-11-04 | Discharge: 2014-11-04 | Disposition: A | Discharge status: Home / Self Care | Payer: Medicaid Other | Attending: Emergency Medicine | Admitting: Emergency Medicine

## 2014-11-04 ENCOUNTER — Emergency Department (HOSPITAL_COMMUNITY): Payer: Medicaid Other

## 2014-11-04 ENCOUNTER — Encounter (HOSPITAL_COMMUNITY): Payer: Self-pay | Admitting: *Deleted

## 2014-11-04 DIAGNOSIS — Z87891 Personal history of nicotine dependence: Secondary | ICD-10-CM | POA: Insufficient documentation

## 2014-11-04 DIAGNOSIS — Y998 Other external cause status: Secondary | ICD-10-CM | POA: Insufficient documentation

## 2014-11-04 DIAGNOSIS — Z8659 Personal history of other mental and behavioral disorders: Secondary | ICD-10-CM | POA: Diagnosis not present

## 2014-11-04 DIAGNOSIS — Y9367 Activity, basketball: Secondary | ICD-10-CM | POA: Insufficient documentation

## 2014-11-04 DIAGNOSIS — I10 Essential (primary) hypertension: Secondary | ICD-10-CM | POA: Diagnosis not present

## 2014-11-04 DIAGNOSIS — S6992XA Unspecified injury of left wrist, hand and finger(s), initial encounter: Secondary | ICD-10-CM | POA: Diagnosis not present

## 2014-11-04 DIAGNOSIS — W231XXA Caught, crushed, jammed, or pinched between stationary objects, initial encounter: Secondary | ICD-10-CM | POA: Insufficient documentation

## 2014-11-04 DIAGNOSIS — Y92310 Basketball court as the place of occurrence of the external cause: Secondary | ICD-10-CM | POA: Insufficient documentation

## 2014-11-04 MED ORDER — IBUPROFEN 800 MG PO TABS: 800.0000 mg | ORAL_TABLET | Freq: Three times a day (TID) | ORAL | Status: DC

## 2014-11-04 NOTE — ED Notes (Signed)
Pt reports jamming her L middle finger last week while playing basketball and states that she re-injured finger tonight.  Swelling noted, able to move but not able to bend it d/t pain.

## 2014-11-04 NOTE — Discharge Instructions (Signed)
Finger Sprain  A finger sprain is a tear in one of the strong, fibrous tissues that connect the bones (ligaments) in your finger. The severity of the sprain depends on how much of the ligament is torn. The tear can be either partial or complete.  CAUSES   Often, sprains are a result of a fall or accident. If you extend your hands to catch an object or to protect yourself, the force of the impact causes the fibers of your ligament to stretch too much. This excess tension causes the fibers of your ligament to tear.  SYMPTOMS   You may have some loss of motion in your finger. Other symptoms include:   Bruising.   Tenderness.   Swelling.  DIAGNOSIS   In order to diagnose finger sprain, your caregiver will physically examine your finger or thumb to determine how torn the ligament is. Your caregiver may also suggest an X-ray exam of your finger to make sure no bones are broken.  TREATMENT   If your ligament is only partially torn, treatment usually involves keeping the finger in a fixed position (immobilization) for a short period. To do this, your caregiver will apply a bandage, cast, or splint to keep your finger from moving until it heals. For a partially torn ligament, the healing process usually takes 2 to 3 weeks.  If your ligament is completely torn, you may need surgery to reconnect the ligament to the bone. After surgery a cast or splint will be applied and will need to stay on your finger or thumb for 4 to 6 weeks while your ligament heals.  HOME CARE INSTRUCTIONS   Keep your injured finger elevated, when possible, to decrease swelling.   To ease pain and swelling, apply ice to your joint twice a day, for 2 to 3 days:   Put ice in a plastic bag.   Place a towel between your skin and the bag.   Leave the ice on for 15 minutes.   Only take over-the-counter or prescription medicine for pain as directed by your caregiver.   Do not wear rings on your injured finger.   Do not leave your finger unprotected  until pain and stiffness go away (usually 3 to 4 weeks).   Do not allow your cast or splint to get wet. Cover your cast or splint with a plastic bag when you shower or bathe. Do not swim.   Your caregiver may suggest special exercises for you to do during your recovery to prevent or limit permanent stiffness.  SEEK IMMEDIATE MEDICAL CARE IF:   Your cast or splint becomes damaged.   Your pain becomes worse rather than better.  MAKE SURE YOU:   Understand these instructions.   Will watch your condition.   Will get help right away if you are not doing well or get worse.  Document Released: 04/09/2004 Document Revised: 05/25/2011 Document Reviewed: 11/03/2010  Exit[employer] Patient Information 2015 [employer]. This information is not intended to replace advice given to you by your health care provider. Make sure you discuss any questions you have with your health care provider.

## 2014-11-04 NOTE — ED Provider Notes (Signed)
CSN: [id number]     Arrival date & time 11/04/14  2047 History  This chart was scribed for non-physician practitioner, Tommy Rainwater, working with Derwood Kaplan, MD by Freida Busman, ED Scribe. This patient was seen in room WTR8/WTR8 and the patient's care was started at 10:39 PM.   Chief Complaint  Patient presents with  . Finger Injury    The history is provided by the patient. No language interpreter was used.     HPI Comments:  Jamie Blackwell is a 29 y.o. female who presents to the Emergency Department complaining of pain to her left middle finger for about 1 week. Pt states she  jammed the finger playing while basketball. No alleviating factors or associated symptoms noted.   Past Medical History  Diagnosis Date  . Abnormal Pap smear 07/22/10    ascus  . Mental disorder     depression  . Hypertension     while pregnant   History reviewed. No pertinent past surgical history. Family History  Problem Relation Age of Onset  . Cancer Paternal Grandmother   . Hypertension Mother    Social History  Substance Use Topics  . Smoking status: Former Smoker    Types: Cigarettes  . Smokeless tobacco: Never Used  . Alcohol Use: No   OB History    Gravida Para Term Preterm AB TAB SAB Ectopic Multiple Living   0 0 0 0 0 0 1     Review of Systems  Musculoskeletal: Positive for myalgias and arthralgias.       Left middle finger   Skin: Negative for wound.  Neurological: Negative for numbness.      Allergies  Review of patient's allergies indicates no known allergies.  Home Medications   Prior to Admission medications   Medication Sig Start Date End Date Taking? Authorizing Provider  cyclobenzaprine (FLEXERIL) 10 MG tablet Take 1 tablet (10 mg total) by mouth 2 (two) times daily as needed for muscle spasms. Patient not taking: Reported on 11/04/2014 12/19/12   Emilia Beck, PA-C  diazepam (VALIUM) 5 MG tablet Take 1 tablet (5 mg total) by mouth 2 (two)  times daily. Patient not taking: Reported on 11/04/2014 12/18/12   Irish Elders, NP  naproxen (NAPROSYN) 500 MG tablet Take 1 tablet (500 mg total) by mouth 2 (two) times daily with a meal. Patient not taking: Reported on 11/04/2014 12/19/12   Kaitlyn Szekalski, PA-C   BP 143/78 mmHg  Pulse 97  Temp(Src) 98 F (36.7 C) (Oral)  Resp 18  Ht 5' 8.25" (1.734 m)  Wt 210 lb (95.255 kg)  BMI 31.68 kg/m2  SpO2 100%  LMP 10/31/2014 Physical Exam  Constitutional: She is oriented to person, place, and time. She appears well-developed and well-nourished. No distress.  HENT:  Head: Normocephalic and atraumatic.  Eyes: Conjunctivae are normal.  Neck: Normal range of motion.  Cardiovascular: Normal rate.   Pulmonary/Chest: Effort normal.  Musculoskeletal: Normal range of motion.  Left middle finger swollen at PIP joint. No redness, warmth. Limited flexion of DIP, PIP joints. MCP nontender.   Neurological: She is alert and oriented to person, place, and time.  Skin: Skin is warm and dry.  Psychiatric: She has a normal mood and affect. Her behavior is normal.  Nursing note and vitals reviewed.   ED Course  Procedures   DIAGNOSTIC STUDIES:  Oxygen Saturation is 100% on RA, normal by my interpretation.    COORDINATION OF CARE:  10:40 PM Discussed  treatment plan with pt at bedside and pt agreed to plan.  Labs Review Labs Reviewed - No data to display  Imaging Review Dg Finger Middle Left  11/04/2014   CLINICAL DATA:  Basketball injury to left middle finger last week. Sharp shooting pain from the MCP to finger tip. Persistent edema. Focal numbness.  EXAM: LEFT MIDDLE FINGER 2+V  COMPARISON:  None.  FINDINGS: Bone fragment along the radial and volar surface of the head of the proximal phalanx of the left third finger with mild displacement of the fracture fragment likely representing an avulsion fracture. There is associated soft tissue swelling. No dislocation of the joint. Middle and distal  phalanges appear intact.  IMPRESSION: Avulsion fracture off of the distal aspect of the proximal phalanx of the left third finger.   Electronically Signed   By: Burman Nieves M.D.   On: 11/04/2014 22:10   I have personally reviewed and evaluated these images and lab results as part of my medical decision-making.   EKG Interpretation None      MDM   Final diagnoses:  None    1. Left middle finger sprain  Uncomplicated subacute injury involving finger tendon. Refer to hand. Splint provided.  I personally performed the services described in this documentation, which was scribed in my presence. The recorded information has been reviewed and is accurate.     Elpidio Anis, PA-C 11/04/14 7829  Derwood Kaplan, MD 11/05/14 [phone]

## 2016-08-26 ENCOUNTER — Emergency Department (HOSPITAL_COMMUNITY)
Admission: EM | Admit: 2016-08-26 | Discharge: 2016-08-26 | Disposition: A | Discharge status: Home / Self Care | Payer: PRIVATE HEALTH INSURANCE | Attending: Emergency Medicine | Admitting: Emergency Medicine

## 2016-08-26 ENCOUNTER — Encounter (HOSPITAL_COMMUNITY): Payer: Self-pay | Admitting: *Deleted

## 2016-08-26 ENCOUNTER — Emergency Department (HOSPITAL_COMMUNITY): Payer: PRIVATE HEALTH INSURANCE

## 2016-08-26 DIAGNOSIS — J40 Bronchitis, not specified as acute or chronic: Secondary | ICD-10-CM | POA: Diagnosis not present

## 2016-08-26 DIAGNOSIS — R0602 Shortness of breath: Secondary | ICD-10-CM | POA: Diagnosis present

## 2016-08-26 DIAGNOSIS — I10 Essential (primary) hypertension: Secondary | ICD-10-CM | POA: Insufficient documentation

## 2016-08-26 DIAGNOSIS — Z87891 Personal history of nicotine dependence: Secondary | ICD-10-CM | POA: Insufficient documentation

## 2016-08-26 LAB — BASIC METABOLIC PANEL
Anion gap: 6 (ref 5–15)
BUN: 7 mg/dL (ref 6–20)
CALCIUM: 8.5 mg/dL — AB (ref 8.9–10.3)
CO2: 24 mmol/L (ref 22–32)
Chloride: 109 mmol/L (ref 101–111)
Creatinine, Ser: 0.77 mg/dL (ref 0.44–1.00)
GFR calc Af Amer: >60 mL/min (ref >60)
GLUCOSE: 76 mg/dL (ref 65–99)
Potassium: 3.9 mmol/L (ref 3.5–5.1)
Sodium: 139 mmol/L (ref 135–145)

## 2016-08-26 LAB — CBC WITH DIFFERENTIAL/PLATELET
Basophils Absolute: 0 10*3/uL (ref 0.0–0.1)
Basophils Relative: 0 %
EOS PCT: 7 %
Eosinophils Absolute: 0.6 10*3/uL (ref 0.0–0.7)
HCT: 41.2 % (ref 36.0–46.0)
Hemoglobin: 14 g/dL (ref 12.0–15.0)
LYMPHS ABS: 2 10*3/uL (ref 0.7–4.0)
LYMPHS PCT: 23 %
MCH: 27.1 pg (ref 26.0–34.0)
MCHC: 34 g/dL (ref 30.0–36.0)
MCV: 79.7 fL (ref 78.0–100.0)
MONO ABS: 0.7 10*3/uL (ref 0.1–1.0)
Monocytes Relative: 8 %
Neutro Abs: 5.3 10*3/uL (ref 1.7–7.7)
Neutrophils Relative %: 62 %
Platelets: 258 10*3/uL (ref 150–400)
RBC: 5.17 MIL/uL — ABNORMAL HIGH (ref 3.87–5.11)
RDW: 13.5 % (ref 11.5–15.5)
WBC: 8.6 10*3/uL (ref 4.0–10.5)

## 2016-08-26 MED ORDER — PREDNISONE 20 MG PO TABS: 40.0000 mg | ORAL_TABLET | Freq: Every day | ORAL | 0 refills | Status: DC

## 2016-08-26 MED ORDER — ALBUTEROL SULFATE HFA 108 (90 BASE) MCG/ACT IN AERS
2.0000 | INHALATION_SPRAY | Freq: Once | RESPIRATORY_TRACT | Status: DC
  Filled 2016-08-26: qty 6.7

## 2016-08-26 MED ORDER — BENZONATATE 100 MG PO CAPS: 100.0000 mg | ORAL_CAPSULE | Freq: Three times a day (TID) | ORAL | 0 refills | Status: DC

## 2016-08-26 MED ORDER — PREDNISONE 20 MG PO TABS
60.0000 mg | ORAL_TABLET | Freq: Once | ORAL | Status: AC
  Administered 2016-08-26: 60 mg via ORAL
  Filled 2016-08-26: qty 3

## 2016-08-26 MED ORDER — IPRATROPIUM-ALBUTEROL 0.5-2.5 (3) MG/3ML IN SOLN
3.0000 mL | Freq: Once | RESPIRATORY_TRACT | Status: AC
  Administered 2016-08-26: 3 mL via RESPIRATORY_TRACT
  Filled 2016-08-26: qty 3

## 2016-08-26 NOTE — ED Triage Notes (Signed)
Patient is alert and oriented x4.  She is complaining of shortness of breath.  Patient has a Hx of Bronchitis and states that it fells that she is having another flare up.

## 2016-08-26 NOTE — ED Provider Notes (Signed)
[facility]EMER[facility]ENCY DEPT Provider Note   CSN: [id number] Arrival date & time: 08/26/16  0815     History   Chief Complaint Chief Complaint  Patient presents with  . Shortness of Breath    HPI Jamie Blackwell is a 31 y.o. female.  HPI Jamie Blackwell is a 31 y.o. female presents to ED with complaint of cough and shortness of breath. States cough started about a week ago. Reports cough is productive with green sputum. Reports subjective fever and chills. Reports chest pain but only when coughing. Tried over the counter cough syrup which did not help. States coughing is causing nausea and gagging. Pt denies hx of lung problems, but was diagnosed with bronchitis before. Pt is a smoker. No exertional chest pain. No swelling in extremities. No recent travel or surgeries. Does not take birth control   Past Medical History:  Diagnosis Date  . Abnormal Pap smear 07/22/10   ascus  . Hypertension    while pregnant  . Mental disorder    depression    There are no active problems to display for this patient.   History reviewed. No pertinent surgical history.  OB History    Gravida Para Term Preterm AB Living   1 1 1  0 0 1   SAB TAB Ectopic Multiple Live Births   0 0 0 0         Home Medications    Prior to Admission medications   Medication Sig Start Date End Date Taking? Authorizing Provider  GuaiFENesin (MUCUS RELIEF ADULT PO) Take 10 mLs by mouth 2 (two) times daily as needed (for cold).   Yes [provider]  Phenyleph-Doxylamine-DM-APAP (NYQUIL SEVERE COLD/FLU) 5-6.25-10-325 MG/15ML LIQD Take 5 mLs by mouth at bedtime as needed (for cold).   Yes [provider]    Family History Family History  Problem Relation Age of Onset  . Cancer Paternal Grandmother   . Hypertension Mother     Social History Social History  Substance Use Topics  . Smoking status: Former Smoker    Types: Cigarettes  . Smokeless tobacco: Never Used  . Alcohol use No      Allergies   Patient has no known allergies.   Review of Systems Review of Systems  Constitutional: Positive for chills and fever.  HENT: Positive for congestion.   Respiratory: Positive for cough, chest tightness and shortness of breath.   Cardiovascular: Negative for chest pain, palpitations and leg swelling.  Gastrointestinal: Negative for abdominal pain, diarrhea, nausea and vomiting.  Genitourinary: Negative for dysuria, flank pain, pelvic pain, vaginal bleeding, vaginal discharge and vaginal pain.  Musculoskeletal: Negative for arthralgias, myalgias, neck pain and neck stiffness.  Skin: Negative for rash.  Neurological: Negative for dizziness, weakness and headaches.  All other systems reviewed and are negative.    Physical Exam Updated Vital Signs BP 117/69 (BP Location: Left Arm)   Pulse 87   Temp 97.5 F (36.4 C) (Oral)   Resp 18   Ht 5\' 9"  (1.753 m)   Wt 93 kg (205 lb)   LMP 08/19/2016 (Exact Date)   SpO2 98%   BMI 30.27 kg/m   Physical Exam  Constitutional: She appears well-developed and well-nourished. No distress.  HENT:  Head: Normocephalic.  Eyes: Conjunctivae are normal.  Neck: Neck supple.  Cardiovascular: Normal rate, regular rhythm and normal heart sounds.   Pulmonary/Chest: Effort normal. No respiratory distress. She has wheezes. She has no rales.  Inspiratory and expiratory wheezes bilaterally. coughing  Abdominal:  Soft. Bowel sounds are normal. She exhibits no distension. There is no tenderness. There is no rebound.  Musculoskeletal: She exhibits no edema.  Neurological: She is alert.  Skin: Skin is warm and dry.  Psychiatric: She has a normal mood and affect. Her behavior is normal.  Nursing note and vitals reviewed.    ED Treatments / Results  Labs (all labs ordered are listed, but only abnormal results are displayed) Labs Reviewed  CBC WITH DIFFERENTIAL/PLATELET  BASIC METABOLIC PANEL    EKG  EKG  Interpretation  Date/Time:  Wednesday August 26 2016 08:56:46 EDT Ventricular Rate:  81 PR Interval:    QRS Duration: 96 QT Interval:  399 QTC Calculation: 464 R Axis:   20 Text Interpretation:  Sinus rhythm normal. no sig change from old except old has excessive artifact Confirmed by Arby Barrette [phone]) on 08/26/2016 8:59:03 AM       Radiology Dg Chest 2 View  Result Date: 08/26/2016 CLINICAL DATA:  Cough and short of breath, fever EXAM: CHEST  2 VIEW COMPARISON:  01/16/2007 FINDINGS: The heart size and mediastinal contours are within normal limits. Both lungs are clear. The visualized skeletal structures are unremarkable. IMPRESSION: No active cardiopulmonary disease. Electronically Signed   By: Marlan Palau M.D.   On: 08/26/2016 09:10    Procedures Procedures (including critical care time)  Medications Ordered in ED Medications  ipratropium-albuterol (DUONEB) 0.5-2.5 (3) MG/3ML nebulizer solution 3 mL (not administered)  predniSONE (DELTASONE) tablet 60 mg (not administered)     Initial Impression / Assessment and Plan / ED Course  I have reviewed the triage vital signs and the nursing notes.  Pertinent labs & imaging results that were available during my care of the patient were reviewed by me and considered in my medical decision making (see chart for details).     Pt in ED with cough, congestion, subjective fever and chills. Will check labs, get CXR. Pt is wheezing, duo neb and prednisone ordered. Will reassess   10:25 AM Labs show a slightly low calcium at 8.5, otherwise normal. Patient's chest x-ray is normal. Patient feels better after breathing treatment. She is in no acute distress. No respiratory distress. Most likely bronchitis. Instructed to stop smoking. Home with prednisone and inhaler. Tessalon Perles for cough. Follow-up with family doctor.  Vitals:   08/26/16 0823 08/26/16 0824  BP: 117/69   Pulse: 87   Resp: 18   Temp: 97.5 F (36.4 C)    TempSrc: Oral   SpO2: 98%   Weight:  93 kg (205 lb)  Height:  5\' 9"  (1.753 m)     Final Clinical Impressions(s) / ED Diagnoses   Final diagnoses:  Bronchitis    New Prescriptions New Prescriptions   BENZONATATE (TESSALON) 100 MG CAPSULE    Take 1 capsule (100 mg total) by mouth every 8 (eight) hours.   PREDNISONE (DELTASONE) 20 MG TABLET    Take 2 tablets (40 mg total) by mouth daily.     Jaynie Crumble, PA-C 08/26/16 1026    Charlynne Pander, MD 08/27/16 2015

## 2016-08-26 NOTE — Discharge Instructions (Signed)
Use inhaler 2 puffs every 4 hours. Take prednisone as prescribed until gone. Use Tessalon Perles for cough. Follow-up with family doctor as needed. Stop smoking.

## 2016-08-26 NOTE — ED Notes (Signed)
Made respiratory aware of Neb treatment ordered

## 2016-08-26 NOTE — ED Notes (Signed)
EMS students at bedside obtaining blood specimens.

## 2016-08-26 NOTE — ED Notes (Signed)
Patient transported to X-ray 

## 2017-12-22 DIAGNOSIS — J301 Allergic rhinitis due to pollen: Secondary | ICD-10-CM | POA: Insufficient documentation

## 2017-12-22 DIAGNOSIS — J209 Acute bronchitis, unspecified: Secondary | ICD-10-CM | POA: Insufficient documentation

## 2017-12-22 DIAGNOSIS — F17200 Nicotine dependence, unspecified, uncomplicated: Secondary | ICD-10-CM | POA: Insufficient documentation

## 2018-01-12 DIAGNOSIS — L299 Pruritus, unspecified: Secondary | ICD-10-CM | POA: Insufficient documentation

## 2018-01-12 DIAGNOSIS — L219 Seborrheic dermatitis, unspecified: Secondary | ICD-10-CM | POA: Insufficient documentation

## 2018-11-15 DIAGNOSIS — F341 Dysthymic disorder: Secondary | ICD-10-CM | POA: Insufficient documentation

## 2020-07-23 ENCOUNTER — Other Ambulatory Visit: Payer: Self-pay

## 2020-07-23 ENCOUNTER — Ambulatory Visit (INDEPENDENT_AMBULATORY_CARE_PROVIDER_SITE_OTHER): Payer: Self-pay

## 2020-07-23 ENCOUNTER — Ambulatory Visit (HOSPITAL_COMMUNITY)
Admission: EM | Admit: 2020-07-23 | Discharge: 2020-07-23 | Disposition: A | Discharge status: Home / Self Care | Payer: Self-pay | Attending: Emergency Medicine | Admitting: Emergency Medicine

## 2020-07-23 ENCOUNTER — Encounter (HOSPITAL_COMMUNITY): Payer: Self-pay | Admitting: Emergency Medicine

## 2020-07-23 DIAGNOSIS — S62339A Displaced fracture of neck of unspecified metacarpal bone, initial encounter for closed fracture: Secondary | ICD-10-CM

## 2020-07-23 DIAGNOSIS — W228XXA Striking against or struck by other objects, initial encounter: Secondary | ICD-10-CM

## 2020-07-23 DIAGNOSIS — M20001 Unspecified deformity of right finger(s): Secondary | ICD-10-CM

## 2020-07-23 DIAGNOSIS — M79641 Pain in right hand: Secondary | ICD-10-CM

## 2020-07-23 DIAGNOSIS — R2231 Localized swelling, mass and lump, right upper limb: Secondary | ICD-10-CM

## 2020-07-23 NOTE — ED Triage Notes (Signed)
Patient has a swollen and painful right hand, lateral.  Right little finger mobility is limited.  Patient hit something on may 8.  Right radial pulse 2 +, and brisk capillary refill

## 2020-07-23 NOTE — ED Provider Notes (Signed)
[facility]    CSN: [id number] Arrival date & time: 07/23/20  1039      History   Chief Complaint Chief Complaint  Patient presents with  . Hand Pain    HPI Bridgette Wolden is a 35 y.o. female.   Patient presents with right hand swelling for 2 days after punching window of car. Initially felt pain but resolved later that night. Unable to move right 5th finger. Entire right finger is numb. Has not attempted to treat. Work shift last night requiring moving and lifting objects.   Past Medical History:  Diagnosis Date  . Abnormal Pap smear 07/22/10   ascus  . Hypertension    while pregnant  . Mental disorder    depression    There are no problems to display for this patient.   History reviewed. No pertinent surgical history.  OB History    Gravida  1   Para  1   Term  1   Preterm  0   AB  0   Living  1     SAB  0   IAB  0   Ectopic  0   Multiple  0   Live Births               Home Medications    Prior to Admission medications   Medication Sig Start Date End Date Taking? Authorizing Provider  benzonatate (TESSALON) 100 MG capsule Take 1 capsule (100 mg total) by mouth every 8 (eight) hours. Patient not taking: Reported on 07/23/2020 08/26/16   Jaynie Crumble, PA-C  GuaiFENesin (MUCUS RELIEF ADULT PO) Take 10 mLs by mouth 2 (two) times daily as needed (for cold). Patient not taking: Reported on 07/23/2020    [provider]  Phenyleph-Doxylamine-DM-APAP 5-6.25-10-325 MG/15ML LIQD Take 5 mLs by mouth at bedtime as needed (for cold). Patient not taking: Reported on 07/23/2020    [provider]  predniSONE (DELTASONE) 20 MG tablet Take 2 tablets (40 mg total) by mouth daily. Patient not taking: Reported on 07/23/2020 08/26/16   Jaynie Crumble, PA-C    Family History Family History  Problem Relation Age of Onset  . Cancer Paternal Grandmother   . Hypertension Mother     Social History Social History    Tobacco Use  . Smoking status: Former Smoker    Types: Cigarettes  . Smokeless tobacco: Never Used  Vaping Use  . Vaping Use: Every day  . Substances: Nicotine  Substance Use Topics  . Alcohol use: No  . Drug use: No     Allergies   Patient has no known allergies.   Review of Systems Review of Systems  Constitutional: Negative.   Respiratory: Negative.   Cardiovascular: Negative.   Musculoskeletal: Positive for joint swelling. Negative for arthralgias, back pain, gait problem, myalgias, neck pain and neck stiffness.  Skin: Negative.   Neurological: Positive for numbness. Negative for dizziness, tremors, seizures, syncope, facial asymmetry, speech difficulty, weakness, light-headedness and headaches.     Physical Exam Triage Vital Signs ED Triage Vitals  Enc Vitals Group     BP 07/23/20 1152 119/86     Pulse Rate 07/23/20 1152 69     Resp 07/23/20 1152 (!) 22     Temp 07/23/20 1152 98.3 F (36.8 C)     Temp Source 07/23/20 1152 Oral     SpO2 07/23/20 1152 100 %     Weight --      Height --  Head Circumference --      Peak Flow --      Pain Score 07/23/20 1256 8     Pain Loc --      Pain Edu? --      Excl. in GC? --    No data found.  Updated Vital Signs BP 119/86 (BP Location: Right Arm) Comment (BP Location): large cuff  Pulse 69   Temp 98.3 F (36.8 C) (Oral)   Resp (!) 22   LMP 07/09/2020   SpO2 100%   Visual Acuity Right Eye Distance:   Left Eye Distance:   Bilateral Distance:    Right Eye Near:   Left Eye Near:    Bilateral Near:     Physical Exam Constitutional:      Appearance: Normal appearance. She is normal weight.  HENT:     Head: Normocephalic.  Eyes:     Extraocular Movements: Extraocular movements intact.  Pulmonary:     Effort: Pulmonary effort is normal.  Musculoskeletal:     Right hand: Swelling present.       Arms:     Comments: No active range of motion of right 5th finger, decreased sensation of right 5th  finger and 5th metatarsal, Swelling over entirety of right hand, ROM and sensation of wrist intact, ROM and sensation of remaining fingers intact, capillary refill less than 3 on right 5th finger, 2+ radial pulse   Skin:    General: Skin is warm and dry.  Neurological:     General: No focal deficit present.     Mental Status: She is alert and oriented to person, place, and time. Mental status is at baseline.  Psychiatric:        Mood and Affect: Mood normal.        Behavior: Behavior normal.        Thought Content: Thought content normal.        Judgment: Judgment normal.      UC Treatments / Results  Labs (all labs ordered are listed, but only abnormal results are displayed) Labs Reviewed - No data to display  EKG   Radiology DG Hand Complete Right  Result Date: 07/23/2020 CLINICAL DATA:  Hit a car window 2 days ago, pain and swelling with little finger deformity EXAM: RIGHT HAND - COMPLETE 3+ VIEW COMPARISON:  None FINDINGS: Soft tissue swelling at ulnar margin of hand and little finger. Osseous mineralization normal. Joint spaces preserved. Oblique fracture distal fifth metacarpal with a volar displacement and apex dorsal angulation. No additional fracture, dislocation, or bone destruction. IMPRESSION: Displaced and angulated distal RIGHT fifth metacarpal fracture. Electronically Signed   By: Ulyses Southward M.D.   On: 07/23/2020 12:19    Procedures Procedures (including critical care time)  Medications Ordered in UC Medications - No data to display  Initial Impression / Assessment and Plan / UC Course  I have reviewed the triage vital signs and the nursing notes.  Pertinent labs & imaging results that were available during my care of the patient were reviewed by me and considered in my medical decision making (see chart for details).  Boxers fracture right hand  1. X-ray- defer to results review 2. otc tylenol or ibuprofen if pain begins 3. Ulnar guttar splint placed by  ortho tech, vascularly intact prior to and after placement, no change in neurological status after placement of splint  4. Scheduled appointment with specialist for 07/25/20 for evaluation  Final Clinical Impressions(s) / UC Diagnoses   Final  diagnoses:  Boxer's fracture, closed, initial encounter     Discharge Instructions     Your x-ray today showed a fracture of the right metatarsal bone   Leave splint on until seen by specialist where they will give you further instructions  Can use over-the-counter ibuprofen or Tylenol every 6 hours as needed if pain begins  Follow-up with orthopedic/hand specialist as soon as possible for reevaluation of fracture   ED Prescriptions    None     PDMP not reviewed this encounter.   Valinda Hoar, NP 07/23/20 1331

## 2020-07-23 NOTE — Progress Notes (Signed)
Orthopedic Tech Progress Note Patient Details:  Jamie Blackwell [birth year] [id number]  Ortho Devices Type of Ortho Device: Ulna gutter splint Ortho Device/Splint Location: RUE Ortho Device/Splint Interventions: Ordered,Application,Adjustment   Post Interventions Patient Tolerated: Well Instructions Provided: Care of device,Poper ambulation with device   Jamie Blackwell 07/23/2020, 12:43 PM

## 2020-07-23 NOTE — Discharge Instructions (Addendum)
Your x-ray today showed a fracture of the right metatarsal bone   Leave splint on until seen by specialist where they will give you further instructions  Can use over-the-counter ibuprofen or Tylenol every 6 hours as needed if pain begins  Follow-up with orthopedic/hand specialist as soon as possible for reevaluation of fracture

## 2020-07-23 NOTE — ED Notes (Signed)
Provided ice pack

## 2020-07-25 ENCOUNTER — Other Ambulatory Visit: Payer: Self-pay | Admitting: Orthopedic Surgery

## 2020-07-25 ENCOUNTER — Other Ambulatory Visit: Payer: Self-pay

## 2020-07-25 ENCOUNTER — Encounter (HOSPITAL_BASED_OUTPATIENT_CLINIC_OR_DEPARTMENT_OTHER): Payer: Self-pay | Admitting: Orthopedic Surgery

## 2020-07-25 DIAGNOSIS — S62326A Displaced fracture of shaft of fifth metacarpal bone, right hand, initial encounter for closed fracture: Secondary | ICD-10-CM | POA: Diagnosis not present

## 2020-07-25 NOTE — H&P (Signed)
Primary Care Provider: None Referring Provider: [facility] Worker's Comp: No Date of Injury or Onset: 07/21/2020  History: CC / Reason for Visit: Right hand HPI: This patient is a 35 year old, right-hand-dominant, female who punched a window on Monday fracturing her right hand.  She was seen on Tuesday at [facility] urgent care where x-rays were taken and she was found to have 1/5 metacarpal fracture.  She was referred here for further evaluation and treatment.  She was placed in an ulnar gutter splint, but she has been removing this in order to take a shower.  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  Past medical history, past surgical history, family history, social history, medications, allergies and review of systems are thoroughly reviewed by me, signed and scanned into SRS today.  The patient is otherwise healthy.  Exam:  Vitals: Refer to EMR. Constitutional:  WD, WN, NAD HEENT:  NCAT, EOMI Neuro/Psych:  Alert & oriented to person, place, and time; appropriate mood & affect Lymphatic: No generalized UE edema or lymphadenopathy Extremities / MSK:  Both UE are normal with respect to appearance, ranges of motion, joint stability, muscle strength/tone, sensation, & perfusion except as otherwise noted:  The splint is removed.  The hand is grossly swollen and there is ecchymosis on the dorsum of the hand.  Thumb index long and ring are capable of nearly full motion.  With assist, there does seem to be some malrotation with the small finger with a rotated touchdown point.  Light touch is intact.  Fingers are warm and well perfused.  Labs / Xrays:  No radiographic studies obtained today.  3 views of the hand taken on 07/23/2020 were reviewed on canopy and demonstrated a closed, extra-articular, midshaft fracture of the fifth metacarpal with volar angulation and shortening.  Assessment: Right fifth metacarpal  fracture  Plan:  The findings are discussed with the patient.  She wishes to move forward with surgical intervention, ORIF of right fifth metacarpal. The details of the operative procedure were discussed with the patient.  Questions were invited and answered.  In addition to the goal of the procedure, the risks of the procedure to include but not limited to bleeding; infection; damage to the nerves or blood vessels that could result in bleeding, numbness, weakness, chronic pain, and the need for additional procedures; stiffness; the need for revision surgery; and anesthetic risks were reviewed.  No specific outcome was guaranteed or implied.  Informed consent was obtained.   Electronically verified by:  Cliffton Asters Janee Morn, MD  CC: None

## 2020-07-26 ENCOUNTER — Other Ambulatory Visit (HOSPITAL_COMMUNITY)
Admission: RE | Admit: 2020-07-26 | Discharge: 2020-07-26 | Disposition: A | Discharge status: Home / Self Care | Payer: BC Managed Care – PPO | Source: Ambulatory Visit | Attending: Orthopedic Surgery | Admitting: Orthopedic Surgery

## 2020-07-26 DIAGNOSIS — Z20822 Contact with and (suspected) exposure to covid-19: Secondary | ICD-10-CM | POA: Diagnosis not present

## 2020-07-26 DIAGNOSIS — Z01812 Encounter for preprocedural laboratory examination: Secondary | ICD-10-CM | POA: Insufficient documentation

## 2020-07-26 LAB — SARS CORONAVIRUS 2 (TAT 6-24 HRS): SARS Coronavirus 2: NEGATIVE

## 2020-07-29 ENCOUNTER — Other Ambulatory Visit (HOSPITAL_COMMUNITY): Payer: Self-pay | Admitting: Orthopedic Surgery

## 2020-07-29 NOTE — Anesthesia Preprocedure Evaluation (Addendum)
Anesthesia Evaluation  Patient identified by MRN, date of birth, ID band Patient awake    Reviewed: Allergy & Precautions, Patient's Chart, lab work & pertinent test results  History of Anesthesia Complications Negative for: history of anesthetic complications  Airway Mallampati: II  TM Distance: >3 FB Neck ROM: Full    Dental  (+) Teeth Intact   Pulmonary Current Smoker and Patient abstained from smoking.,    Pulmonary exam normal        Cardiovascular hypertension,  Rhythm:Regular Rate:Normal     Neuro/Psych Depression negative neurological ROS     GI/Hepatic negative GI ROS, Neg liver ROS,   Endo/Other  Morbid obesity  Renal/GU negative Renal ROS  negative genitourinary   Musculoskeletal negative musculoskeletal ROS (+)   Abdominal (+)   Bowel sounds: normal.  Peds  Hematology negative hematology ROS (+)   Anesthesia Other Findings   Reproductive/Obstetrics                            Anesthesia Physical Anesthesia Plan  ASA: II  Anesthesia Plan: MAC and Regional   Post-op Pain Management:    Induction: Intravenous  PONV Risk Score and Plan: 2 and Propofol infusion, TIVA, Treatment may vary due to age or medical condition, Ondansetron, Dexamethasone and Midazolam  Airway Management Planned: Natural Airway, Nasal Cannula and Simple Face Mask  Additional Equipment: None  Intra-op Plan:   Post-operative Plan:   Informed Consent: I have reviewed the patients History and Physical, chart, labs and discussed the procedure including the risks, benefits and alternatives for the proposed anesthesia with the patient or authorized representative who has indicated his/her understanding and acceptance.     Dental advisory given  Plan Discussed with: CRNA  Anesthesia Plan Comments:        Anesthesia Quick Evaluation

## 2020-08-02 ENCOUNTER — Ambulatory Visit (HOSPITAL_COMMUNITY): Payer: BC Managed Care – PPO

## 2020-08-02 ENCOUNTER — Other Ambulatory Visit: Payer: Self-pay

## 2020-08-02 ENCOUNTER — Ambulatory Visit (HOSPITAL_BASED_OUTPATIENT_CLINIC_OR_DEPARTMENT_OTHER)
Admission: RE | Admit: 2020-08-02 | Discharge: 2020-08-02 | Disposition: A | Discharge status: Home / Self Care | Payer: BC Managed Care – PPO | Attending: Orthopedic Surgery | Admitting: Orthopedic Surgery

## 2020-08-02 ENCOUNTER — Encounter (HOSPITAL_BASED_OUTPATIENT_CLINIC_OR_DEPARTMENT_OTHER)
Admission: RE | Disposition: A | Discharge status: Home / Self Care | Payer: Self-pay | Source: Home / Self Care | Attending: Orthopedic Surgery

## 2020-08-02 ENCOUNTER — Ambulatory Visit (HOSPITAL_BASED_OUTPATIENT_CLINIC_OR_DEPARTMENT_OTHER): Payer: BC Managed Care – PPO | Admitting: Anesthesiology

## 2020-08-02 ENCOUNTER — Encounter (HOSPITAL_BASED_OUTPATIENT_CLINIC_OR_DEPARTMENT_OTHER): Payer: Self-pay | Admitting: Orthopedic Surgery

## 2020-08-02 DIAGNOSIS — W25XXXA Contact with sharp glass, initial encounter: Secondary | ICD-10-CM | POA: Diagnosis not present

## 2020-08-02 DIAGNOSIS — S62306D Unspecified fracture of fifth metacarpal bone, right hand, subsequent encounter for fracture with routine healing: Secondary | ICD-10-CM | POA: Diagnosis not present

## 2020-08-02 DIAGNOSIS — F32A Depression, unspecified: Secondary | ICD-10-CM | POA: Diagnosis not present

## 2020-08-02 DIAGNOSIS — S62326A Displaced fracture of shaft of fifth metacarpal bone, right hand, initial encounter for closed fracture: Secondary | ICD-10-CM | POA: Diagnosis not present

## 2020-08-02 DIAGNOSIS — S62306A Unspecified fracture of fifth metacarpal bone, right hand, initial encounter for closed fracture: Secondary | ICD-10-CM | POA: Diagnosis not present

## 2020-08-02 DIAGNOSIS — I10 Essential (primary) hypertension: Secondary | ICD-10-CM | POA: Diagnosis not present

## 2020-08-02 DIAGNOSIS — Z419 Encounter for procedure for purposes other than remedying health state, unspecified: Secondary | ICD-10-CM

## 2020-08-02 DIAGNOSIS — G8918 Other acute postprocedural pain: Secondary | ICD-10-CM | POA: Diagnosis not present

## 2020-08-02 HISTORY — PX: OPEN REDUCTION INTERNAL FIXATION (ORIF) METACARPAL: SHX6234

## 2020-08-02 LAB — POCT PREGNANCY, URINE: Preg Test, Ur: NEGATIVE

## 2020-08-02 SURGERY — OPEN REDUCTION INTERNAL FIXATION (ORIF) METACARPAL
Anesthesia: Monitor Anesthesia Care | Site: Finger | Laterality: Right

## 2020-08-02 MED ORDER — ROPIVACAINE HCL 5 MG/ML IJ SOLN
INTRAMUSCULAR | Status: DC | PRN
  Administered 2020-08-02: 30 mL via PERINEURAL

## 2020-08-02 MED ORDER — OXYCODONE HCL 5 MG PO TABS: 5.0000 mg | ORAL_TABLET | Freq: Four times a day (QID) | ORAL | 0 refills | Status: DC | PRN

## 2020-08-02 MED ORDER — FENTANYL CITRATE (PF) 100 MCG/2ML IJ SOLN
INTRAMUSCULAR | Status: AC
  Filled 2020-08-02: qty 2

## 2020-08-02 MED ORDER — LACTATED RINGERS IV SOLN: INTRAVENOUS | Status: DC

## 2020-08-02 MED ORDER — ACETAMINOPHEN 10 MG/ML IV SOLN
INTRAVENOUS | Status: AC
  Filled 2020-08-02: qty 100

## 2020-08-02 MED ORDER — MIDAZOLAM HCL 2 MG/2ML IJ SOLN
INTRAMUSCULAR | Status: AC
  Filled 2020-08-02: qty 2

## 2020-08-02 MED ORDER — FENTANYL CITRATE (PF) 100 MCG/2ML IJ SOLN
INTRAMUSCULAR | Status: DC | PRN
  Administered 2020-08-02 (×2): 50 ug via INTRAVENOUS

## 2020-08-02 MED ORDER — IBUPROFEN 200 MG PO TABS: 600.0000 mg | ORAL_TABLET | Freq: Four times a day (QID) | ORAL | Status: AC

## 2020-08-02 MED ORDER — PROMETHAZINE HCL 25 MG/ML IJ SOLN: 6.2500 mg | INTRAMUSCULAR | Status: DC | PRN

## 2020-08-02 MED ORDER — PROPOFOL 500 MG/50ML IV EMUL
INTRAVENOUS | Status: DC | PRN
  Administered 2020-08-02: 50 ug/kg/min via INTRAVENOUS

## 2020-08-02 MED ORDER — CEFAZOLIN IN SODIUM CHLORIDE 3-0.9 GM/100ML-% IV SOLN
INTRAVENOUS | Status: AC
  Filled 2020-08-02: qty 100

## 2020-08-02 MED ORDER — FENTANYL CITRATE (PF) 100 MCG/2ML IJ SOLN
100.0000 ug | Freq: Once | INTRAMUSCULAR | Status: AC
  Administered 2020-08-02: 100 ug via INTRAVENOUS

## 2020-08-02 MED ORDER — ACETAMINOPHEN 325 MG PO TABS: 650.0000 mg | ORAL_TABLET | Freq: Four times a day (QID) | ORAL | Status: AC

## 2020-08-02 MED ORDER — MIDAZOLAM HCL 2 MG/2ML IJ SOLN
2.0000 mg | Freq: Once | INTRAMUSCULAR | Status: AC
  Administered 2020-08-02: 2 mg via INTRAVENOUS

## 2020-08-02 MED ORDER — PROPOFOL 10 MG/ML IV BOLUS
INTRAVENOUS | Status: DC | PRN
  Administered 2020-08-02: 200 mg via INTRAVENOUS

## 2020-08-02 MED ORDER — CEFAZOLIN IN SODIUM CHLORIDE 3-0.9 GM/100ML-% IV SOLN
3.0000 g | INTRAVENOUS | Status: AC
  Administered 2020-08-02: 3 g via INTRAVENOUS

## 2020-08-02 MED ORDER — MIDAZOLAM HCL 5 MG/5ML IJ SOLN
INTRAMUSCULAR | Status: DC | PRN
  Administered 2020-08-02: 2 mg via INTRAVENOUS

## 2020-08-02 MED ORDER — ONDANSETRON HCL 4 MG/2ML IJ SOLN
INTRAMUSCULAR | Status: AC
  Filled 2020-08-02: qty 2

## 2020-08-02 MED ORDER — DEXAMETHASONE SODIUM PHOSPHATE 10 MG/ML IJ SOLN
INTRAMUSCULAR | Status: DC | PRN
  Administered 2020-08-02: 5 mg

## 2020-08-02 MED ORDER — ACETAMINOPHEN 10 MG/ML IV SOLN
1000.0000 mg | Freq: Once | INTRAVENOUS | Status: DC | PRN
  Administered 2020-08-02: 1000 mg via INTRAVENOUS

## 2020-08-02 MED ORDER — LIDOCAINE 2% (20 MG/ML) 5 ML SYRINGE
INTRAMUSCULAR | Status: AC
  Filled 2020-08-02: qty 5

## 2020-08-02 MED ORDER — ONDANSETRON HCL 4 MG/2ML IJ SOLN
INTRAMUSCULAR | Status: DC | PRN
  Administered 2020-08-02: 4 mg via INTRAVENOUS

## 2020-08-02 MED ORDER — FENTANYL CITRATE (PF) 100 MCG/2ML IJ SOLN
25.0000 ug | INTRAMUSCULAR | Status: DC | PRN
  Administered 2020-08-02 (×2): 50 ug via INTRAVENOUS

## 2020-08-02 MED ORDER — LIDOCAINE HCL (CARDIAC) PF 100 MG/5ML IV SOSY
PREFILLED_SYRINGE | INTRAVENOUS | Status: DC | PRN
  Administered 2020-08-02: 20 mg via INTRAVENOUS

## 2020-08-02 SURGICAL SUPPLY — 59 items
APL PRP STRL LF DISP 70% ISPRP (MISCELLANEOUS) ×1
BIT DRILL 1.1X60MM (BIT) ×1 IMPLANT
BLADE MINI RND TIP GREEN BEAV (BLADE) IMPLANT
BLADE SURG 15 STRL LF DISP TIS (BLADE) ×1 IMPLANT
BLADE SURG 15 STRL SS (BLADE) ×2
BNDG CMPR 9X4 STRL LF SNTH (GAUZE/BANDAGES/DRESSINGS) ×1
BNDG COHESIVE 4X5 TAN STRL (GAUZE/BANDAGES/DRESSINGS) ×2 IMPLANT
BNDG ESMARK 4X9 LF (GAUZE/BANDAGES/DRESSINGS) ×2 IMPLANT
BNDG GAUZE ELAST 4 BULKY (GAUZE/BANDAGES/DRESSINGS) ×2 IMPLANT
CANISTER SUCT 1200ML W/VALVE (MISCELLANEOUS) ×2 IMPLANT
CHLORAPREP W/TINT 26 (MISCELLANEOUS) ×2 IMPLANT
CORD BIPOLAR FORCEPS 12FT (ELECTRODE) ×2 IMPLANT
COVER BACK TABLE 60X90IN (DRAPES) ×2 IMPLANT
COVER MAYO STAND STRL (DRAPES) ×2 IMPLANT
CUFF TOURN SGL QUICK 18X4 (TOURNIQUET CUFF) ×2 IMPLANT
DRAPE C-ARM 42X72 X-RAY (DRAPES) ×2 IMPLANT
DRAPE EXTREMITY T 121X128X90 (DISPOSABLE) ×2 IMPLANT
DRAPE SURG 17X23 STRL (DRAPES) ×2 IMPLANT
DRILL 1.1X60MM (BIT) ×2
DRIVER BIT 1.5 (TRAUMA) ×2 IMPLANT
DRSG EMULSION OIL 3X3 NADH (GAUZE/BANDAGES/DRESSINGS) ×2 IMPLANT
GAUZE SPONGE 4X4 12PLY STRL LF (GAUZE/BANDAGES/DRESSINGS) ×2 IMPLANT
GLOVE SRG 8 PF TXTR STRL LF DI (GLOVE) ×1 IMPLANT
GLOVE SURG ENC MOIS LTX SZ7.5 (GLOVE) ×2 IMPLANT
GLOVE SURG LTX SZ6.5 (GLOVE) ×2 IMPLANT
GLOVE SURG UNDER POLY LF SZ7 (GLOVE) ×2 IMPLANT
GLOVE SURG UNDER POLY LF SZ8 (GLOVE) ×2
GOWN STRL REUS W/ TWL LRG LVL3 (GOWN DISPOSABLE) ×2 IMPLANT
GOWN STRL REUS W/TWL LRG LVL3 (GOWN DISPOSABLE) ×4
GOWN STRL REUS W/TWL XL LVL3 (GOWN DISPOSABLE) ×2 IMPLANT
LOCK SCREW 1.5X15MM (Screw) ×2 IMPLANT
NEEDLE HYPO 25X1 1.5 SAFETY (NEEDLE) ×2 IMPLANT
NS IRRIG 1000ML POUR BTL (IV SOLUTION) ×2 IMPLANT
PACK BASIN DAY SURGERY FS (CUSTOM PROCEDURE TRAY) ×2 IMPLANT
PADDING CAST ABS 4INX4YD NS (CAST SUPPLIES) ×1
PADDING CAST ABS COTTON 4X4 ST (CAST SUPPLIES) ×1 IMPLANT
PLATE TYL 1.5 (Plate) ×2 IMPLANT
SCREW L 1.5X14 (Screw) ×4 IMPLANT
SCREW LOCK 1.5X15MM (Screw) ×1 IMPLANT
SCREW LOCKING 1.5X10 (Screw) ×6 IMPLANT
SCREW LOCKING 1.5X11MM (Screw) ×2 IMPLANT
SCREW LOCKING 1.5X13MM (Screw) ×4 IMPLANT
SLEEVE SCD COMPRESS KNEE MED (STOCKING) ×2 IMPLANT
SLING ARM FOAM STRAP XLG (SOFTGOODS) ×2 IMPLANT
SPLINT PLASTER CAST XFAST 3X15 (CAST SUPPLIES) ×6 IMPLANT
SPLINT PLASTER XTRA FASTSET 3X (CAST SUPPLIES) ×6
STOCKINETTE 6  STRL (DRAPES) ×2
STOCKINETTE 6 STRL (DRAPES) ×1 IMPLANT
SUCTION FRAZIER HANDLE 10FR (MISCELLANEOUS) ×2
SUCTION TUBE FRAZIER 10FR DISP (MISCELLANEOUS) ×1 IMPLANT
SUT VIC AB 3-0 SH 27 (SUTURE) ×2
SUT VIC AB 3-0 SH 27X BRD (SUTURE) ×1 IMPLANT
SUT VICRYL RAPIDE 4-0 (SUTURE) IMPLANT
SUT VICRYL RAPIDE 4/0 PS 2 (SUTURE) ×2 IMPLANT
SYR 10ML LL (SYRINGE) ×2 IMPLANT
SYR BULB EAR ULCER 3OZ GRN STR (SYRINGE) ×2 IMPLANT
TOWEL GREEN STERILE FF (TOWEL DISPOSABLE) ×2 IMPLANT
TUBE CONNECTING 20X1/4 (TUBING) ×2 IMPLANT
UNDERPAD 30X36 HEAVY ABSORB (UNDERPADS AND DIAPERS) ×2 IMPLANT

## 2020-08-02 NOTE — Anesthesia Procedure Notes (Signed)
Procedure Name: LMA Insertion Performed by: Jeison Delpilar, [place], CRNA Pre-anesthesia Checklist: Patient identified, Emergency Drugs available, Suction available and Patient being monitored Patient Re-evaluated:Patient Re-evaluated prior to induction Oxygen Delivery Method: Circle system utilized Preoxygenation: Pre-oxygenation with 100% oxygen Induction Type: IV induction Ventilation: Mask ventilation without difficulty LMA: LMA inserted LMA Size: 4.0 Number of attempts: 1 Airway Equipment and Method: Bite block Placement Confirmation: positive ETCO2 Tube secured with: Tape Dental Injury: Teeth and Oropharynx as per pre-operative assessment        

## 2020-08-02 NOTE — Progress Notes (Signed)
Assisted Dr. Gavin Potters with right, ultrasound guided, supraclavicular block. Side rails up, monitors on throughout procedure. See vital signs in flow sheet. Tolerated Procedure well.

## 2020-08-02 NOTE — Discharge Instructions (Signed)
Discharge Instructions   You have a dressing with a plaster splint incorporated in it. Move your fingers as much as possible, making a full fist and fully opening the fist. Elevate your hand to reduce pain & swelling of the digits.  Ice over the operative site may be helpful to reduce pain & swelling.  DO NOT USE HEAT. Pain medicine has been prescribed for you.  Take Ibuprofen 600 mg and Tylenol 650 mg together every 6 hours for pain management. Take Oxycodone additionally as a rescue medicine for severe post operative pain.  Leave the dressing in place until you return to our office.  You may shower, but keep the bandage clean & dry.  You may drive a car when you are off of prescription pain medications and can safely control your vehicle with both hands. Call to make an appointment for 10-15 days from the date of surgery for your first post operative follow up.   Please call [phone] during normal business hours or [phone] after hours for any problems. Including the following:  - excessive redness of the incisions - drainage for more than 4 days - fever of more than 101.5 F  *Please note that pain medications will not be refilled after hours or on weekends.  Work Status: No lifting, gripping or grasping with the right hand greater than paper and pencil tasks. You may return to work on May 23rd with these restrictions.    Post Anesthesia Home Care Instructions  Activity: Get plenty of rest for the remainder of the day. A responsible individual must stay with you for 24 hours following the procedure.  For the next 24 hours, DO NOT: -Drive a car -Advertising copywriter -Drink alcoholic beverages -Take any medication unless instructed by your physician -Make any legal decisions or sign important papers.  Meals: Start with liquid foods such as gelatin or soup. Progress to regular foods as tolerated. Avoid greasy, spicy, heavy foods. If nausea and/or vomiting occur, drink only  clear liquids until the nausea and/or vomiting subsides. Call your physician if vomiting continues.  Special Instructions/Symptoms: Your throat may feel dry or sore from the anesthesia or the breathing tube placed in your throat during surgery. If this causes discomfort, gargle with warm salt water. The discomfort should disappear within 24 hours.  If you had a scopolamine patch placed behind your ear for the management of post- operative nausea and/or vomiting:  1. The medication in the patch is effective for 72 hours, after which it should be removed.  Wrap patch in a tissue and discard in the trash. Wash hands thoroughly with soap and water. 2. You may remove the patch earlier than 72 hours if you experience unpleasant side effects which may include dry mouth, dizziness or visual disturbances. 3. Avoid touching the patch. Wash your hands with soap and water after contact with the patch.  Regional Anesthesia Blocks  1. Numbness or the inability to move the "blocked" extremity may last from 3-48 hours after placement. The length of time depends on the medication injected and your individual response to the medication. If the numbness is not going away after 48 hours, call your surgeon.  2. The extremity that is blocked will need to be protected until the numbness is gone and the  Strength has returned. Because you cannot feel it, you will need to take extra care to avoid injury. Because it may be weak, you may have difficulty moving it or using it. You may not know what  position it is in without looking at it while the block is in effect.  3. For blocks in the legs and feet, returning to weight bearing and walking needs to be done carefully. You will need to wait until the numbness is entirely gone and the strength has returned. You should be able to move your leg and foot normally before you try and bear weight or walk. You will need someone to be with you when you first try to ensure you do not  fall and possibly risk injury.  4. Bruising and tenderness at the needle site are common side effects and will resolve in a few days.  5. Persistent numbness or new problems with movement should be communicated to the surgeon or the [facility] [phone] [facility] [phone]).  No tylenol until after 4:30pm if needed today.

## 2020-08-02 NOTE — Op Note (Signed)
08/02/2020  9:00 AM  PATIENT:  Jamie Blackwell  35 y.o. female  PRE-OPERATIVE DIAGNOSIS:  Displaced R 5th MC fx  POST-OPERATIVE DIAGNOSIS:  Same  PROCEDURE:  ORIF R 5th MC fx  SURGEON: Rayvon Char. Grandville Silos, MD  PHYSICIAN ASSISTANT: Morley Kos, OPA-C  ANESTHESIA:  regional and MAC  SPECIMENS:  None  DRAINS:   None  EBL:  less than 50 mL  PREOPERATIVE INDICATIONS:  Jamie Blackwell is a  35 y.o. female with displaced fracture of the right fifth metacarpal  The risks benefits and alternatives were discussed with the patient preoperatively including but not limited to the risks of infection, bleeding, nerve injury, cardiopulmonary complications, the need for revision surgery, among others, and the patient verbalized understanding and consented to proceed.  OPERATIVE IMPLANTS: Biomet ALPS hand set 1.34m plate/screws  OPERATIVE PROCEDURE:  After receiving prophylactic antibiotics and a regional block, the patient was escorted to the operative theatre and placed in a supine position.  A surgical "time-out" was performed during which the planned procedure, proposed operative site, and the correct patient identity were compared to the operative consent and agreement confirmed by the circulating nurse according to current facility policy.  Following application of a tourniquet to the operative extremity, the exposed skin was prepped with Chloraprep and draped in the usual sterile fashion.  The limb was exsanguinated with an Esmarch bandage and the tourniquet inflated to approximately 1077mg higher than systolic BP.  A direct linear longitudinal incision was made over the dorsal aspect of the fifth metacarpal.  Full-thickness flaps were elevated.  The extensor tendons were mobilized and retracted.  The fascia was split and the periosteum incised over the dorsal apex of the bone, reflecting it radially and ulnarly exposing the fracture site.  The fracture was cleaned of early healing debris  so that it could be anatomically aligned.  With that held anatomically aligned, the appropriate size and shape 1.5 mm plate was applied and secured with the appropriate locking screws, securing anatomic alignment and stabilization of the fracture.  Digital rotation was good.  Final images were obtained.  The wound was then copiously irrigated and the periosteum reapproximated as best could be done over the plate with 4-0 Vicryl Rapide interrupted sutures.  The fascia was similarly closed to try to create a barrier between the extensor tendons and the plate.  The tourniquet was released, additional hemostasis obtained with bipolar electrocautery and the wound was again irrigated before closing the skin with a running 4-0 Vicryl Rapide horizontal mattress sutures.  Short arm splint dressing was applied with a volar plaster component and she was taken to recovery room in stable condition.  DISPOSITION: She will be discharged home today, beginning ROM now,  and following up with [facility] 10 to 15 days for reevaluation, to include x-rays of the right hand out of splint and determination of need for formal therapy

## 2020-08-02 NOTE — Interval H&P Note (Signed)
History and Physical Interval Note:  08/02/2020 9:00 AM  Jamie Blackwell  has presented today for surgery, with the diagnosis of RIGHT FIFTH METACARPAL FRACTURE.  The various methods of treatment have been discussed with the patient and family. After consideration of risks, benefits and other options for treatment, the patient has consented to  Procedure(s): OPEN TREATMENT OF RIGHT FIFTH METACARPAL FRACTURE (Right) as a surgical intervention.  The patient's history has been reviewed, patient examined, no change in status, stable for surgery.  I have reviewed the patient's chart and labs.  Questions were answered to the patient's satisfaction.     Jodi Marble

## 2020-08-02 NOTE — Anesthesia Postprocedure Evaluation (Signed)
Anesthesia Post Note  Patient: Jamie Blackwell  Procedure(s) Performed: OPEN TREATMENT OF RIGHT FIFTH METACARPAL FRACTURE (Right Finger)     Patient location during evaluation: PACU Anesthesia Type: Regional Level of consciousness: awake Pain management: pain level controlled Vital Signs Assessment: post-procedure vital signs reviewed and stable Respiratory status: spontaneous breathing Cardiovascular status: stable Postop Assessment: no apparent nausea or vomiting Anesthetic complications: no   No complications documented.  Last Vitals:  Vitals:   08/02/20 1045 08/02/20 1100  BP:  (!) 131/97  Pulse: 83 77  Resp: 17 15  Temp:    SpO2: 97% 100%    Last Pain:  Vitals:   08/02/20 1030  TempSrc:   PainSc: 10-Worst pain ever                 Beyza Bellino

## 2020-08-02 NOTE — Anesthesia Procedure Notes (Signed)
Anesthesia Regional Block: Supraclavicular block   Pre-Anesthetic Checklist: ,, timeout performed, Correct Patient, Correct Site, Correct Laterality, Correct Procedure, Correct Position, site marked, Risks and benefits discussed,  Surgical consent,  Pre-op evaluation,  At surgeon's request and post-op pain management  Laterality: Right  Prep: Dura Prep       Needles:  Injection technique: Single-shot  Needle Type: Echogenic Stimulator Needle     Needle Length: 5cm  Needle Gauge: 20     Additional Needles:   Procedures:,,,, ultrasound used (permanent image in chart),,,,  Narrative:  Start time: 08/02/2020 8:32 AM End time: 08/02/2020 8:37 AM Injection made incrementally with aspirations every 5 mL.  Performed by: Personally  Anesthesiologist: Atilano Median, DO  Additional Notes: Patient identified. Risks/Benefits/Options discussed with patient including but not limited to bleeding, infection, nerve damage, failed block, incomplete pain control. Patient expressed understanding and wished to proceed. All questions were answered. Sterile technique was used throughout the entire procedure. Please see nursing notes for vital signs. Aspirated in 5cc intervals with injection for negative confirmation. Patient was given instructions on fall risk and not to get out of bed. All questions and concerns addressed with instructions to call with any issues or inadequate analgesia.

## 2020-08-02 NOTE — Transfer of Care (Signed)
Immediate Anesthesia Transfer of Care Note  Patient: Jamie Blackwell  Procedure(s) Performed: OPEN TREATMENT OF RIGHT FIFTH METACARPAL FRACTURE (Right Finger)  Patient Location: PACU  Anesthesia Type:GA combined with regional for post-op pain  Level of Consciousness: awake  Airway & Oxygen Therapy: Patient Spontanous Breathing and Patient connected to face mask oxygen  Post-op Assessment: Report given to RN and Post -op Vital signs reviewed and stable  Post vital signs: Reviewed and stable  Last Vitals:  Vitals Value Taken Time  BP    Temp    Pulse    Resp 17 08/02/20 1028  SpO2    Vitals shown include unvalidated device data.  Last Pain:  Vitals:   08/02/20 0719  TempSrc: Oral  PainSc: 0-No pain         Complications: No complications documented.

## 2020-08-05 ENCOUNTER — Encounter (HOSPITAL_BASED_OUTPATIENT_CLINIC_OR_DEPARTMENT_OTHER): Payer: Self-pay | Admitting: Orthopedic Surgery

## 2020-08-13 DIAGNOSIS — S62326D Displaced fracture of shaft of fifth metacarpal bone, right hand, subsequent encounter for fracture with routine healing: Secondary | ICD-10-CM | POA: Diagnosis not present

## 2020-09-12 DIAGNOSIS — S62326D Displaced fracture of shaft of fifth metacarpal bone, right hand, subsequent encounter for fracture with routine healing: Secondary | ICD-10-CM | POA: Diagnosis not present

## 2021-10-21 ENCOUNTER — Encounter: Payer: Self-pay | Admitting: Family Medicine

## 2021-10-21 ENCOUNTER — Ambulatory Visit (INDEPENDENT_AMBULATORY_CARE_PROVIDER_SITE_OTHER): Payer: BC Managed Care – PPO | Admitting: Family Medicine

## 2021-10-21 VITALS — BP 130/85 | HR 88 | Temp 98.1°F | Resp 16 | Ht 70.0 in | Wt 260.0 lb

## 2021-10-21 DIAGNOSIS — L732 Hidradenitis suppurativa: Secondary | ICD-10-CM

## 2021-10-21 DIAGNOSIS — Z7689 Persons encountering health services in other specified circumstances: Secondary | ICD-10-CM

## 2021-10-21 NOTE — Progress Notes (Unsigned)
   New Patient Office Visit  Subjective    Patient ID: Jamie Blackwell, female    DOB: 11/04/1985  Age: 36 y.o. MRN: [id number]  CC:  Chief Complaint  Patient presents with   Establish Care    HPI Jamie Blackwell presents to establish care   Outpatient Encounter Medications as of 10/21/2021  Medication Sig   acetaminophen (TYLENOL) 325 MG tablet Take 2 tablets (650 mg total) by mouth every 6 (six) hours.   ibuprofen (ADVIL) 200 MG tablet Take 3 tablets (600 mg total) by mouth every 6 (six) hours.   [DISCONTINUED] loratadine (CLARITIN) 10 MG tablet Take 10 mg by mouth daily.   [DISCONTINUED] oxyCODONE (ROXICODONE) 5 MG immediate release tablet Take 1 tablet (5 mg total) by mouth every 6 (six) hours as needed for severe pain.   No facility-administered encounter medications on file as of 10/21/2021.    Past Medical History:  Diagnosis Date   Abnormal Pap smear 07/22/10   [facility]   Hypertension    while pregnant   Mental disorder    depression    Past Surgical History:  Procedure Laterality Date   OPEN REDUCTION INTERNAL FIXATION (ORIF) METACARPAL Right 08/02/2020   Procedure: OPEN TREATMENT OF RIGHT FIFTH METACARPAL FRACTURE;  Surgeon: Mack Hook, MD;  Location: [facility];  Service: Orthopedics;  Laterality: Right;    Family History  Problem Relation Age of Onset   Cancer Paternal Grandmother    Hypertension Mother     Social History   Socioeconomic History   Marital status: Married    Spouse name: Not on file   Number of children: Not on file   Years of education: Not on file   Highest education level: Not on file  Occupational History   Not on file  Tobacco Use   Smoking status: Some Days    Types: Cigarettes   Smokeless tobacco: Never  Vaping Use   Vaping Use: Every day   Substances: Nicotine  Substance and Sexual Activity   Alcohol use: No   Drug use: No   Sexual activity: Yes    Birth control/protection: None  Other Topics  Concern   Not on file  Social History Narrative   Not on file   Social Determinants of Health   Financial Resource Strain: Not on file  Food Insecurity: Not on file  Transportation Needs: Not on file  Physical Activity: Not on file  Stress: Not on file  Social Connections: Not on file  Intimate Partner Violence: Not on file    ROS      Objective    BP 130/85   Pulse 88   Temp 98.1 F (36.7 C) (Oral)   Resp 16   Ht 5\' 10"  (1.778 m)   Wt 260 lb (117.9 kg)   SpO2 97%   BMI 37.31 kg/m   Physical Exam  {Labs (Optional):23779}    Assessment & Plan:   Problem List Items Addressed This Visit   None Visit Diagnoses     Hidradenitis suppurativa    -  Primary   Encounter to establish care           No follow-ups on file.   , MD

## 2021-10-21 NOTE — Progress Notes (Unsigned)
Patient is here to established care. Patient is concern about her breast getting boils. Patient said that her breast are starting to hurt really bad and she need to speak with a pcp .

## 2021-11-24 ENCOUNTER — Encounter: Payer: Self-pay | Admitting: Family Medicine

## 2021-11-24 ENCOUNTER — Ambulatory Visit (INDEPENDENT_AMBULATORY_CARE_PROVIDER_SITE_OTHER): Payer: BC Managed Care – PPO | Admitting: Family Medicine

## 2021-11-24 VITALS — BP 137/83 | HR 95 | Temp 97.7°F | Resp 16 | Ht 67.5 in | Wt 254.8 lb

## 2021-11-24 DIAGNOSIS — Z13220 Encounter for screening for lipoid disorders: Secondary | ICD-10-CM | POA: Diagnosis not present

## 2021-11-24 DIAGNOSIS — Z1159 Encounter for screening for other viral diseases: Secondary | ICD-10-CM

## 2021-11-24 DIAGNOSIS — Z130 Encounter for screening for diseases of the blood and blood-forming organs and certain disorders involving the immune mechanism: Secondary | ICD-10-CM

## 2021-11-24 DIAGNOSIS — Z0000 Encounter for general adult medical examination without abnormal findings: Secondary | ICD-10-CM

## 2021-11-24 DIAGNOSIS — Z1329 Encounter for screening for other suspected endocrine disorder: Secondary | ICD-10-CM | POA: Diagnosis not present

## 2021-11-24 DIAGNOSIS — Z114 Encounter for screening for human immunodeficiency virus [HIV]: Secondary | ICD-10-CM

## 2021-11-24 NOTE — Progress Notes (Signed)
Established Patient Office Visit  Subjective    Patient ID: Jamie Blackwell, female    DOB: 1985/07/25  Age: 36 y.o. MRN: [id number]  CC:  Chief Complaint  Patient presents with   Annual Exam    HPI Jamie Blackwell presents for routine annual exam. Patient denies acute complaints or concerns.    Outpatient Encounter Medications as of 11/24/2021  Medication Sig   acetaminophen (TYLENOL) 325 MG tablet Take 2 tablets (650 mg total) by mouth every 6 (six) hours.   ibuprofen (ADVIL) 200 MG tablet Take 3 tablets (600 mg total) by mouth every 6 (six) hours.   No facility-administered encounter medications on file as of 11/24/2021.    Past Medical History:  Diagnosis Date   Abnormal Pap smear 07/22/2010   [facility]   Allergy    Anxiety    Depression    Hypertension    while pregnant   Mental disorder    depression    Past Surgical History:  Procedure Laterality Date   FRACTURE SURGERY  07/2020   OPEN REDUCTION INTERNAL FIXATION (ORIF) METACARPAL Right 08/02/2020   Procedure: OPEN TREATMENT OF RIGHT FIFTH METACARPAL FRACTURE;  Surgeon: Milly Jakob, MD;  Location: [facility];  Service: Orthopedics;  Laterality: Right;    Family History  Problem Relation Age of Onset   Cancer Paternal Grandmother    Hypertension Mother    Depression Mother    Diabetes Mother    Stroke Mother    Vision loss Mother    Alcohol abuse Father    Drug abuse Father    Learning disabilities Brother    Obesity Sister     Social History   Socioeconomic History   Marital status: Married    Spouse name: Not on file   Number of children: Not on file   Years of education: Not on file   Highest education level: Not on file  Occupational History   Not on file  Tobacco Use   Smoking status: Every Day    Packs/day: 0.25    Years: 15.00    Total pack years: 3.75    Types: Cigarettes   Smokeless tobacco: Never  Vaping Use   Vaping Use: Every day   Substances: Nicotine   Substance and Sexual Activity   Alcohol use: No   Drug use: Yes    Types: Marijuana   Sexual activity: Yes    Birth control/protection: None  Other Topics Concern   Not on file  Social History Narrative   Not on file   Social Determinants of Health   Financial Resource Strain: Not on file  Food Insecurity: Not on file  Transportation Needs: Not on file  Physical Activity: Not on file  Stress: Not on file  Social Connections: Not on file  Intimate Partner Violence: Not on file    Review of Systems  All other systems reviewed and are negative.       Objective    BP 137/83   Pulse 95   Temp 97.7 F (36.5 C) (Oral)   Resp 16   Ht 5' 7.5" (1.715 m)   Wt 254 lb 12.8 oz (115.6 kg)   SpO2 93%   BMI 39.32 kg/m   Physical Exam Vitals and nursing note reviewed.  Constitutional:      General: She is not in acute distress.    Appearance: She is obese.  HENT:     Head: Normocephalic and atraumatic.     Right Ear:  Tympanic membrane, ear canal and external ear normal.     Left Ear: Tympanic membrane, ear canal and external ear normal.     Nose: Nose normal.     Mouth/Throat:     Mouth: Mucous membranes are moist.     Pharynx: Oropharynx is clear.  Eyes:     Conjunctiva/sclera: Conjunctivae normal.     Pupils: Pupils are equal, round, and reactive to light.  Neck:     Thyroid: No thyromegaly.  Cardiovascular:     Rate and Rhythm: Normal rate and regular rhythm.     Heart sounds: Normal heart sounds. No murmur heard. Pulmonary:     Effort: Pulmonary effort is normal. No respiratory distress.     Breath sounds: Normal breath sounds.  Abdominal:     General: There is no distension.     Palpations: Abdomen is soft. There is no mass.     Tenderness: There is no abdominal tenderness.  Musculoskeletal:        General: Normal range of motion.     Cervical back: Normal range of motion and neck supple.  Skin:    General: Skin is warm and dry.  Neurological:      General: No focal deficit present.     Mental Status: She is alert and oriented to person, place, and time.  Psychiatric:        Mood and Affect: Mood normal.        Behavior: Behavior normal.         Assessment & Plan:   1. Annual physical exam  - CMP14+EGFR  2. Screening for deficiency anemia  - CBC with Differential  3. Screening for lipid disorders  - Lipid Panel  4. Screening for endocrine/metabolic/immunity disorders  - Hemoglobin A1c - TSH  5. Screening for HIV (human immunodeficiency virus)  - HIV antibody (with reflex)  6. Need for hepatitis C screening test  - Hepatitis C Antibody  No follow-ups on file.   Becky Sax, MD

## 2021-11-25 LAB — HEMOGLOBIN A1C
Est. average glucose Bld gHb Est-mCnc: 117 mg/dL
Hgb A1c MFr Bld: 5.7 % — ABNORMAL HIGH (ref 4.8–5.6)

## 2021-11-25 LAB — CBC WITH DIFFERENTIAL/PLATELET
Basophils Absolute: 0 10*3/uL (ref 0.0–0.2)
Basos: 1 %
EOS (ABSOLUTE): 0.2 10*3/uL (ref 0.0–0.4)
Eos: 3 %
Hematocrit: 40.5 % (ref 34.0–46.6)
Hemoglobin: 12.7 g/dL (ref 11.1–15.9)
Immature Grans (Abs): 0 10*3/uL (ref 0.0–0.1)
Immature Granulocytes: 0 %
Lymphocytes Absolute: 1.9 10*3/uL (ref 0.7–3.1)
Lymphs: 28 %
MCH: 27 pg (ref 26.6–33.0)
MCHC: 31.4 g/dL — ABNORMAL LOW (ref 31.5–35.7)
MCV: 86 fL (ref 79–97)
Monocytes Absolute: 0.4 10*3/uL (ref 0.1–0.9)
Monocytes: 6 %
Neutrophils Absolute: 4.3 10*3/uL (ref 1.4–7.0)
Neutrophils: 62 %
Platelets: 272 10*3/uL (ref 150–450)
RBC: 4.7 x10E6/uL (ref 3.77–5.28)
RDW: 13.7 % (ref 11.7–15.4)
WBC: 6.9 10*3/uL (ref 3.4–10.8)

## 2021-11-25 LAB — CMP14+EGFR
ALT: 7 IU/L (ref 0–32)
AST: 14 IU/L (ref 0–40)
Albumin/Globulin Ratio: 1.3 (ref 1.2–2.2)
Albumin: 4.4 g/dL (ref 3.9–4.9)
Alkaline Phosphatase: 42 IU/L — ABNORMAL LOW (ref 44–121)
BUN/Creatinine Ratio: 16 (ref 9–23)
BUN: 13 mg/dL (ref 6–20)
Bilirubin Total: <0.2 mg/dL (ref 0.0–1.2)
CO2: 19 mmol/L — ABNORMAL LOW (ref 20–29)
Calcium: 9.8 mg/dL (ref 8.7–10.2)
Chloride: 106 mmol/L (ref 96–106)
Creatinine, Ser: 0.79 mg/dL (ref 0.57–1.00)
Globulin, Total: 3.3 g/dL (ref 1.5–4.5)
Glucose: 86 mg/dL (ref 70–99)
Potassium: 4.4 mmol/L (ref 3.5–5.2)
Sodium: 141 mmol/L (ref 134–144)
Total Protein: 7.7 g/dL (ref 6.0–8.5)
eGFR: 99 mL/min/{1.73_m2} (ref >59)

## 2021-11-25 LAB — HEPATITIS C ANTIBODY: Hep C Virus Ab: NONREACTIVE

## 2021-11-25 LAB — HIV ANTIBODY (ROUTINE TESTING W REFLEX): HIV Screen 4th Generation wRfx: NONREACTIVE

## 2021-11-25 LAB — LIPID PANEL
Chol/HDL Ratio: 2.5 ratio (ref 0.0–4.4)
Cholesterol, Total: 156 mg/dL (ref 100–199)
HDL: 63 mg/dL (ref >39)
LDL Chol Calc (NIH): 79 mg/dL (ref 0–99)
Triglycerides: 74 mg/dL (ref 0–149)
VLDL Cholesterol Cal: 14 mg/dL (ref 5–40)

## 2021-11-25 LAB — TSH: TSH: 2.42 u[IU]/mL (ref 0.450–4.500)
